# Patient Record
Sex: Female | Born: 1970 | Race: White | Hispanic: Yes | Marital: Married | Smoking: Never smoker
Health system: Southern US, Community
[De-identification: ages and names within clinical notes are randomized; demographics above are authoritative.]

## PROBLEM LIST (undated history)

## (undated) DIAGNOSIS — E785 Hyperlipidemia, unspecified: Secondary | ICD-10-CM

## (undated) DIAGNOSIS — E119 Type 2 diabetes mellitus without complications: Secondary | ICD-10-CM

## (undated) DIAGNOSIS — Z87898 Personal history of other specified conditions: Secondary | ICD-10-CM

## (undated) HISTORY — DX: Hyperlipidemia, unspecified: E78.5

## (undated) HISTORY — DX: Type 2 diabetes mellitus without complications: E11.9

## (undated) HISTORY — PX: TUBAL LIGATION: SHX77

## (undated) HISTORY — DX: Personal history of other specified conditions: Z87.898

---

## 1998-06-05 ENCOUNTER — Other Ambulatory Visit: Admission: RE | Admit: 1998-06-05 | Discharge: 1998-06-05 | Payer: Self-pay | Admitting: Gynecology

## 1998-09-21 ENCOUNTER — Emergency Department (HOSPITAL_COMMUNITY): Admission: EM | Admit: 1998-09-21 | Discharge: 1998-09-21 | Payer: Self-pay | Admitting: Emergency Medicine

## 2004-04-05 ENCOUNTER — Inpatient Hospital Stay (HOSPITAL_COMMUNITY): Admission: AD | Admit: 2004-04-05 | Discharge: 2004-04-05 | Payer: Self-pay | Admitting: *Deleted

## 2004-07-02 ENCOUNTER — Ambulatory Visit (HOSPITAL_COMMUNITY): Admission: RE | Admit: 2004-07-02 | Discharge: 2004-07-02 | Payer: Self-pay | Admitting: *Deleted

## 2004-07-02 ENCOUNTER — Ambulatory Visit: Payer: Self-pay | Admitting: *Deleted

## 2004-07-04 ENCOUNTER — Ambulatory Visit: Payer: Self-pay | Admitting: Family Medicine

## 2004-07-18 ENCOUNTER — Ambulatory Visit: Payer: Self-pay | Admitting: *Deleted

## 2004-12-02 ENCOUNTER — Ambulatory Visit: Payer: Self-pay | Admitting: Obstetrics and Gynecology

## 2004-12-05 ENCOUNTER — Ambulatory Visit: Payer: Self-pay | Admitting: *Deleted

## 2004-12-05 ENCOUNTER — Inpatient Hospital Stay (HOSPITAL_COMMUNITY): Admission: AD | Admit: 2004-12-05 | Discharge: 2004-12-08 | Payer: Self-pay | Admitting: Family Medicine

## 2006-03-13 ENCOUNTER — Ambulatory Visit: Payer: Self-pay | Admitting: Obstetrics & Gynecology

## 2006-03-31 ENCOUNTER — Ambulatory Visit (HOSPITAL_COMMUNITY): Admission: RE | Admit: 2006-03-31 | Discharge: 2006-03-31 | Payer: Self-pay | Admitting: Obstetrics & Gynecology

## 2006-03-31 ENCOUNTER — Ambulatory Visit: Payer: Self-pay | Admitting: Obstetrics & Gynecology

## 2006-04-15 ENCOUNTER — Ambulatory Visit: Payer: Self-pay | Admitting: Obstetrics and Gynecology

## 2013-02-21 ENCOUNTER — Encounter: Payer: Self-pay | Admitting: Internal Medicine

## 2013-02-21 ENCOUNTER — Ambulatory Visit: Payer: No Typology Code available for payment source | Attending: Internal Medicine | Admitting: Internal Medicine

## 2013-02-21 VITALS — BP 117/83 | HR 85 | Temp 97.8°F | Resp 17 | Wt 139.2 lb

## 2013-02-21 DIAGNOSIS — N920 Excessive and frequent menstruation with regular cycle: Secondary | ICD-10-CM | POA: Insufficient documentation

## 2013-02-21 DIAGNOSIS — K5900 Constipation, unspecified: Secondary | ICD-10-CM | POA: Insufficient documentation

## 2013-02-21 DIAGNOSIS — Z139 Encounter for screening, unspecified: Secondary | ICD-10-CM

## 2013-02-21 DIAGNOSIS — Z23 Encounter for immunization: Secondary | ICD-10-CM

## 2013-02-21 LAB — COMPLETE METABOLIC PANEL WITH GFR
ALT: 20 U/L (ref 0–35)
Alkaline Phosphatase: 69 U/L (ref 39–117)
GFR, Est Non African American: >89 mL/min
Sodium: 137 mEq/L (ref 135–145)
Total Bilirubin: 0.6 mg/dL (ref 0.3–1.2)
Total Protein: 8.1 g/dL (ref 6.0–8.3)

## 2013-02-21 LAB — CBC WITH DIFFERENTIAL/PLATELET
Basophils Absolute: 0 10*3/uL (ref 0.0–0.1)
Basophils Relative: 0 % (ref 0–1)
Eosinophils Absolute: 0.1 10*3/uL (ref 0.0–0.7)
MCH: 29.6 pg (ref 26.0–34.0)
MCHC: 34.1 g/dL (ref 30.0–36.0)
Neutrophils Relative %: 70 % (ref 43–77)
Platelets: 162 10*3/uL (ref 150–400)

## 2013-02-21 LAB — LIPID PANEL
HDL: 50 mg/dL (ref >39)
LDL Cholesterol: 114 mg/dL — ABNORMAL HIGH (ref 0–99)
Triglycerides: 204 mg/dL — ABNORMAL HIGH (ref <150)
VLDL: 41 mg/dL — ABNORMAL HIGH (ref 0–40)

## 2013-02-21 MED ORDER — MAGNESIUM HYDROXIDE 400 MG/5ML PO SUSP: 5.0000 mL | Freq: Every day | ORAL | Status: DC | PRN

## 2013-02-21 NOTE — Progress Notes (Signed)
MRN: [id number] Name: Amber Horn  Sex: female Age: 42 y.o. DOB: [birth year]  Allergies: Penicillins  Chief Complaint  Patient presents with  . Abdominal Pain  . Constipation    HPI: Patient is 42 y.o. female who comes for the first time to establish medical care, she reported to have lot of bloating and constipation, denies any nausea vomiting any urinary symptoms, also reported to have heavy menstrual periods, has not seen a gynecologist, denies any fever chills chest shortness of breath.  History reviewed. No pertinent past medical history.  History reviewed. No pertinent past surgical history.    Medication List       This list is accurate as of: 02/21/13  3:36 PM.  Always use your most recent med list.               magnesium hydroxide 400 MG/5ML suspension  Commonly known as:  MILK OF MAGNESIA  Take 5 mLs by mouth daily as needed for mild constipation.        Meds ordered this encounter  Medications  . magnesium hydroxide (MILK OF MAGNESIA) 400 MG/5ML suspension    Sig: Take 5 mLs by mouth daily as needed for mild constipation.    Dispense:  360 mL    Refill:  1    Immunization History  Administered Date(s) Administered  . Influenza,inj,Quad PF,36+ Mos 02/21/2013    History  Substance Use Topics  . Smoking status: Never Smoker   . Smokeless tobacco: Not on file  . Alcohol Use: No    Review of Systems  As noted in HPI  Filed Vitals:   02/21/13 1441  BP: 117/83  Pulse: 85  Temp: 97.8 F (36.6 C)  Resp: 17    Physical Exam  Physical Exam  Constitutional: She is oriented to person, place, and time. No distress.  Eyes: EOM are normal. Pupils are equal, round, and reactive to light.  Cardiovascular: Normal rate and regular rhythm.   Pulmonary/Chest: Breath sounds normal. No respiratory distress. She has no wheezes. She has no rales.  Musculoskeletal: She exhibits no edema.  Neurological: She is alert and oriented to person,  place, and time.    CBC No results found for this basename: wbc,  rbc,  hgb,  hct,  plt,  mcv,  neutrabs,  lymphsabs,  monoabs,  eosabs,  basosabs    CMP  No results found for this basename: na,  k,  cl,  co2,  glucose,  bun,  creatinine,  calcium,  prot,  albumin,  ast,  alt,  alkphos,  bilitot,  gfrnonaa,  gfraa    No results found for this basename: chol,  tri,  ldl    No components found with this basename: hga1c    No results found for this basename: AST    Assessment and Plan  Unspecified constipation - Plan: Prescribed magnesium hydroxide (MILK OF MAGNESIA) 400 MG/5ML suspension to use when necessary  Screening - Plan: MM Digital Screening, CBC with Differential, COMPLETE METABOLIC PANEL WITH GFR, TSH, Lipid panel, Vit D  25 hydroxy (rtn osteoporosis monitoring)  Menorrhagia - Plan: Ambulatory referral to Gynecology  flu shot given today    Health Maintenance : -Pap Smear: reffered to GYN  -Mammogram: ordered Flu shot given today   Return in about 6 weeks (around 04/04/2013).  Doris Cheadle, MD

## 2013-02-21 NOTE — Progress Notes (Signed)
Patient is here for check up Complains of having stomach pains Feels bloated and at times like her bowels are swollen When she gets this way she gets constipated Sometimes pain below her belly button States this has been an ongoing problem Needs referral for Ob/gyn as well

## 2013-03-16 ENCOUNTER — Encounter: Payer: Self-pay | Admitting: Obstetrics & Gynecology

## 2013-04-04 ENCOUNTER — Ambulatory Visit: Payer: No Typology Code available for payment source | Admitting: Internal Medicine

## 2013-04-18 ENCOUNTER — Encounter: Payer: No Typology Code available for payment source | Admitting: Obstetrics & Gynecology

## 2013-04-22 ENCOUNTER — Telehealth: Payer: Self-pay | Admitting: Internal Medicine

## 2013-04-22 NOTE — Telephone Encounter (Addendum)
Pt says she did not receive call with lab results after 02/21/13 office visit.  Also says she neve got a call with appt date/time for GYN referral or mammogram. Pt is still having abd pain.  Please f/u with pt regarding results and appts, she was supposed have these done before her next follow-up appt.

## 2013-04-25 ENCOUNTER — Ambulatory Visit: Payer: No Typology Code available for payment source | Admitting: Internal Medicine

## 2013-05-26 ENCOUNTER — Other Ambulatory Visit: Payer: Self-pay | Admitting: Obstetrics and Gynecology

## 2013-05-26 DIAGNOSIS — Z1231 Encounter for screening mammogram for malignant neoplasm of breast: Secondary | ICD-10-CM

## 2013-06-21 ENCOUNTER — Encounter (HOSPITAL_COMMUNITY): Payer: Self-pay

## 2013-06-21 ENCOUNTER — Ambulatory Visit (HOSPITAL_COMMUNITY)
Admission: RE | Admit: 2013-06-21 | Discharge: 2013-06-21 | Disposition: A | Discharge status: Home / Self Care | Payer: No Typology Code available for payment source | Source: Ambulatory Visit | Attending: Obstetrics and Gynecology | Admitting: Obstetrics and Gynecology

## 2013-06-21 VITALS — BP 118/64 | Temp 98.5°F | Ht <= 58 in | Wt 140.4 lb

## 2013-06-21 DIAGNOSIS — Z1239 Encounter for other screening for malignant neoplasm of breast: Secondary | ICD-10-CM

## 2013-06-21 DIAGNOSIS — Z1231 Encounter for screening mammogram for malignant neoplasm of breast: Secondary | ICD-10-CM

## 2013-06-21 NOTE — Patient Instructions (Signed)
Taught Kathaleen GrinderGuillermina Gonzalez-Garnica how to perform BSE and gave educational materials to take home. Patient did not need a Pap smear today due to last Pap smear was in March 2014 per patient. Let her know [facility] will cover Pap smears every 3 years unless has a history of abnormal Pap smears. Let patient know will follow up with her within the next couple weeks with results by letter or phone. Kathaleen GrinderGuillermina Gonzalez-Garnica verbalized understanding. Patient escorted to mammography for a screening mammogram.  Saintclair Halstedhristine Poll Aprille Sawhney, RN 11:12 AM

## 2013-06-21 NOTE — Progress Notes (Addendum)
No complaints today.  Pap Smear: Pap smear not completed today. Last Pap smear was in March 2014 at [facility] and normal per patient. Per patient has no history of an abnormal Pap smear. No Pap smear results in EPIC.  Physical exam: Breasts Breasts symmetrical. No skin abnormalities bilateral breasts. No nipple retraction bilateral breasts. No nipple discharge bilateral breasts. No lymphadenopathy. No lumps palpated bilateral breasts. No complaints of pain or tenderness on exam. Patient escorted to mammography for a screening mammogram.        Pelvic/Bimanual No Pap smear completed today since last Pap smear was in March 2014. Pap smear not indicated per [facility] guidelines.

## 2013-07-01 ENCOUNTER — Other Ambulatory Visit: Payer: No Typology Code available for payment source

## 2013-07-01 ENCOUNTER — Ambulatory Visit (HOSPITAL_BASED_OUTPATIENT_CLINIC_OR_DEPARTMENT_OTHER): Payer: No Typology Code available for payment source

## 2013-07-01 VITALS — BP 120/76 | HR 64 | Temp 98.1°F | Resp 16 | Ht <= 58 in | Wt 138.3 lb

## 2013-07-01 DIAGNOSIS — Z0000 Encounter for general adult medical examination without abnormal findings: Secondary | ICD-10-CM

## 2013-07-01 LAB — GLUCOSE (CC13): GLUCOSE: 114 mg/dL (ref 70–140)

## 2013-07-01 LAB — LIPID PANEL
CHOLESTEROL: 195 mg/dL (ref 0–200)
HDL: 46 mg/dL (ref >39)
LDL CALC: 122 mg/dL — AB (ref 0–99)
TRIGLYCERIDES: 133 mg/dL (ref <150)
Total CHOL/HDL Ratio: 4.2 Ratio
VLDL: 27 mg/dL (ref 0–40)

## 2013-07-01 LAB — HEMOGLOBIN A1C
HEMOGLOBIN A1C: 6.2 % — AB (ref <5.7)
Mean Plasma Glucose: 131 mg/dL — ABNORMAL HIGH (ref <117)

## 2013-07-01 NOTE — Patient Instructions (Signed)
  Discussed health assessment with patient. Discussed need for good shoes and leg support to improve hurting legs. She will be called with results of lab work and we will then discussed health Coaching needs. Patient verbalized understanding.

## 2013-07-01 NOTE — Progress Notes (Signed)
Patient [facility] a [facility] and is currently a [facility] patient effective 06/21/2013.   Clinical Measurements: Patient is 4 ft. 8 1/2 inches, weight 138.3 lbs, waist circumference 32.5 inches, and hip circumference 38 inches.   Medical History: Patient has no history of high cholesterol. Patient does not have a history of hypertension or diabetes. Per patient no diagnosed history of coronary heart disease, heart attack, heart failure, stroke/TIA, vascular disease or congenital heart defects.   Blood Pressure, Self-measurement: Patient states has no reason to check Blood pressure.  Nutrition Assessment: Patient stated that eats 1 fruit every day. Patient states she does not eat vegetables every day. She eats 3 or more ounces of whole grains daily. Patient doesn't eat two or more servings of fish weekly. Patient states she does drink less than 36 ounces or 450 calories of beverages with added sugars weekly.  Patient stated she does not watch her salt intake.  Physical Activity Assessment: Patient stated she does around 510 minutes of moderate exercise including walking and household chores a week. States that does no vigorous exercise.  Smoking Status: Patient had never smoked and is not around smoke.  Quality of Life Assessment: In assessing patient's quality of life she stated that out of the past 30 days that she has felt herPhysical health was not three days due to her feet hurting.Patient also stated that in the past 30 days that her mental health is  good including stress, depression and problems with emotions. Patient did state that out of the past 30 days she felt her physical or mental health had not kept her from doing her usual activities including self-care, work or recreation.   Plan: Lab work will be done today including a lipid panel, blood glucose, and Hgb A1C. Will call lab results when they are finished. Patient will return for Health Coaching and BP check.

## 2013-07-22 ENCOUNTER — Ambulatory Visit: Payer: No Typology Code available for payment source | Admitting: Internal Medicine

## 2013-11-11 ENCOUNTER — Ambulatory Visit: Payer: Self-pay

## 2013-11-11 DIAGNOSIS — Z789 Other specified health status: Secondary | ICD-10-CM

## 2013-11-11 NOTE — Patient Instructions (Signed)
Patient will follow 1600 calorie diet plan. Will review all handouts and exercise/activity book. Will increase fiber in diet. Will decrease Carbohydrates to less than 200 per day. Will increase exercise and use pedometer. Will measure portion sizes. Will call if has any questions. Will call patient in three weeks. Patient verbalized understanding.   

## 2013-11-11 NOTE — Progress Notes (Unsigned)
Patient returns today for Health Coaching regarding Nutrition for her borderline AIC and activity with interpreter.   NUTRITION: Patient and I went over Know Your Health Numbers again.Patient reviewed A1C handout to see about prediabetes, normal and  diabetes range. Patient went over what prediabetes is, complications that can result if do not get levels to normal, and diabetes level. Patient watched Video "Dorrene German" about how to control what eat and amount to keep blood sugar in the normal range.Discussed increasing fiber in diet, reading labels and serving sizes.  Discussed watching carbohydrates, how to count them, serving sizes and number of carbs per day allowance. Used plastic serving sizes to see portion sizes and what were starches, fruit and vegetables. Patient stated that she knew she was eating too much rice.Patient reviewed weight /activity chart. Per patient she does moderate activity and by age should be eating at 2,000 cal. Diet per day. Explained to patient and showed her that she is overweight and has BMI of 30.2. Patient went over 1,600 cal diet and we broke it down to the number of servings she could have. Patient received and reviewed the following handouts in Spanish: My Plate, Carb counting Menu, prediabetes, A1C Exam, Make half your grains whole. Gave patient measuring cup to measure serving sizes and demonstrated the serving sizes.  ACTIVITY: Discussed activity and walking Patient discussed other activity. Patient received pedometer and was explained and shown how it works. Received and reviewed Exercise and Physical Activity [employer] book in Spanish.  Miscellaneous: Discussed where can get orange card renewed and that I would be following up with her 3 more times because of Health Coaching ([place]), and would rescreen next year.  PLAN: Pursue orange card. Increasing amount of walking per day and add other exercises to plan, Decrease carbohydrates in diet. Lose weight. Follow up  times three per phone unless needs and can call.

## 2014-01-09 ENCOUNTER — Encounter (HOSPITAL_COMMUNITY): Payer: Self-pay

## 2015-03-23 ENCOUNTER — Other Ambulatory Visit: Payer: Self-pay | Admitting: Obstetrics and Gynecology

## 2015-03-23 DIAGNOSIS — Z1231 Encounter for screening mammogram for malignant neoplasm of breast: Secondary | ICD-10-CM

## 2015-03-28 ENCOUNTER — Telehealth (HOSPITAL_COMMUNITY): Payer: Self-pay | Admitting: *Deleted

## 2015-03-28 NOTE — Telephone Encounter (Signed)
Telephoned patient at home # and confirmed appointment with [facility]. Used interpreter Delorise Royals.

## 2015-03-29 ENCOUNTER — Encounter (HOSPITAL_COMMUNITY): Payer: Self-pay

## 2015-03-29 ENCOUNTER — Encounter (HOSPITAL_COMMUNITY): Payer: Self-pay | Admitting: *Deleted

## 2015-03-29 ENCOUNTER — Ambulatory Visit (HOSPITAL_COMMUNITY)
Admission: RE | Admit: 2015-03-29 | Discharge: 2015-03-29 | Disposition: A | Discharge status: Home / Self Care | Payer: Self-pay | Source: Ambulatory Visit | Attending: Obstetrics and Gynecology | Admitting: Obstetrics and Gynecology

## 2015-03-29 ENCOUNTER — Ambulatory Visit
Admission: RE | Admit: 2015-03-29 | Discharge: 2015-03-29 | Disposition: A | Discharge status: Home / Self Care | Payer: No Typology Code available for payment source | Source: Ambulatory Visit | Attending: Obstetrics and Gynecology | Admitting: Obstetrics and Gynecology

## 2015-03-29 VITALS — BP 108/64 | Temp 98.3°F | Ht 59.0 in | Wt 143.0 lb

## 2015-03-29 DIAGNOSIS — Z01419 Encounter for gynecological examination (general) (routine) without abnormal findings: Secondary | ICD-10-CM

## 2015-03-29 DIAGNOSIS — Z1231 Encounter for screening mammogram for malignant neoplasm of breast: Secondary | ICD-10-CM

## 2015-03-29 NOTE — Progress Notes (Signed)
No complaints today.   Pap Smear: Pap smear completed today. Last Pap smear was in March 2014 at the [facility] and normal per patient. Per patient has no history of an abnormal Pap smear. No Pap smear results in EPIC.  Physical exam: Breasts Breasts symmetrical. No skin abnormalities bilateral breasts. No nipple retraction bilateral breasts. No nipple discharge bilateral breasts. No lymphadenopathy. No lumps palpated bilateral breasts. No complaints of pain or tenderness on exam. Referred patient to the [facility] for a screening mammogram. Appointment scheduled for Thursday, March 29, 2015 at 1130.  Pelvic/Bimanual   Ext Genitalia No lesions, no swelling and no discharge observed on external genitalia.         Vagina Vagina pink and normal texture. No lesions and small amount of whitish/yellowish colored discharge observed in vagina.          Cervix Cervix is present. Cervix pink and of normal texture. Cervix friable. No discharge observed.     Uterus Uterus is present and palpable. Uterus in normal position and normal size.        Adnexae Bilateral ovaries present and palpable. No tenderness on palpation.          Rectovaginal No rectal exam completed today since patient had no rectal complaints. No skin abnormalities observed on exam.    Smoking History: Patient has never smoked.  Patient Navigation: Patient education provided. Access to services provided for patient through [facility]. Spanish interpreter provided.  Used Spanish interpreter [employer].

## 2015-03-29 NOTE — Patient Instructions (Signed)
Educational materials on self breast awareness given. Explained to Amber Horn that [facility] will cover Pap smears and HPV typing every 5 years unless has a history of abnormal Pap smears. Referred patient to the [facility] for a screening mammogram. Appointment scheduled for Thursday, March 29, 2015 at 1130. Patient aware of appointment and will be there. Let patient know will follow up with her within the next couple weeks with results of Pap smear by phone. Let patient know the Breast Center will follow up with her within the next couple of weeks with result to mammogram by letter or phone. Amber Horn verbalized understanding.  Amber Horn, Amber Maser, RN 1:05 PM

## 2015-03-30 LAB — CYTOLOGY - PAP

## 2015-04-02 ENCOUNTER — Telehealth (HOSPITAL_COMMUNITY): Payer: Self-pay | Admitting: *Deleted

## 2015-04-02 NOTE — Telephone Encounter (Signed)
Telephoned patient at home # and discussed negative pap smear results. HPV was negative. Next pap smear due in 5 years. Used interpreter Julie Sowell.  

## 2015-04-06 ENCOUNTER — Other Ambulatory Visit: Payer: No Typology Code available for payment source

## 2015-04-06 ENCOUNTER — Ambulatory Visit (HOSPITAL_BASED_OUTPATIENT_CLINIC_OR_DEPARTMENT_OTHER): Payer: Self-pay

## 2015-04-06 VITALS — BP 106/78 | HR 72 | Temp 97.9°F | Resp 16 | Ht 59.0 in | Wt 141.8 lb

## 2015-04-06 DIAGNOSIS — Z0000 Encounter for general adult medical examination without abnormal findings: Secondary | ICD-10-CM

## 2015-04-06 LAB — LIPID PANEL
Chol/HDL Ratio: 3.9 ratio units (ref 0.0–4.4)
Cholesterol, Total: 183 mg/dL (ref 100–199)
HDL: 47 mg/dL (ref >39)
LDL Calculated: 114 mg/dL — ABNORMAL HIGH (ref 0–99)
Triglycerides: 112 mg/dL (ref 0–149)
VLDL Cholesterol Cal: 22 mg/dL (ref 5–40)

## 2015-04-06 LAB — GLUCOSE (CC13): GLUCOSE: 103 mg/dL (ref 70–140)

## 2015-04-06 NOTE — Progress Notes (Signed)
Patient is returning as a re screen to the [employer] and is currently a [facility] patient effective 03/29/2015. An interpreter was not present until lab work. Was able to complete by self with my spanish knowledge and patient's little English knowledge  Clinical Measurements: Patient is 4 ft.11 inches, weight 141.8 lbs, BMI 28.7 .   Medical History: Patient has no history of high cholesterol. Patient does not have a history of hypertension or diabetes. Per patient no diagnosed history of coronary heart disease, heart attack, heart failure, stroke/TIA, vascular disease or congenital heart defects.   Blood Pressure, Self-measurement: Patient states has no reason to check Blood pressure.  Nutrition Assessment: Patient stated that eats 1 fruit every day. Patient states she does not eat vegetables every day. Per patient states does eat 3 or more ounces or more of whole grains daily. Patient stated does not eat two or more servings of fish weekly. Patient states she does  Not drink more than 36 ounces or 450 calories of beverages with added sugars weekly. Patient stated she does not watch her salt intake.  Physical Activity Assessment: Patient stated that does zumba walks 3 days a week for total of 180 minutes of moderate exercise. Patient does on occasion do 60 minutes of vigorous exercise.  Smoking Status: Patient has never smoked and is not exposed to smoke..  Quality of Life Assessment: In assessing patient's quality of life she stated that out of the past 30 days that she has felt her health is good all of them. Patient also stated that in the past 30 days that her mental health was good including stress, depression and problems with emotions for all days. Patient did state that out of the past 30 days she felt her physical or mental health had not kept her from doing her usual activities including self-care, work or recreation.   Plan: Lab work was done today including a lipid panel, blood  glucose, and Hgb A1C. Will call lab results when results are finished. Will discuss risk reduction counseling when call results. Will follow through with program's goals.

## 2015-04-06 NOTE — Patient Instructions (Signed)
Discussed health assessment with patient.She will be called with results of lab work and we will then discussed any further follow up the patient needs. Patient verbalized understanding. 

## 2015-04-07 LAB — HEMOGLOBIN A1C
Est. average glucose Bld gHb Est-mCnc: 131 mg/dL
Hemoglobin A1c: 6.2 % — ABNORMAL HIGH (ref 4.8–5.6)

## 2015-04-10 ENCOUNTER — Telehealth: Payer: Self-pay

## 2015-04-10 NOTE — Telephone Encounter (Signed)
Called to inform about lab work from 04/06/15. Interpreter, Delorise Royals informed patient: cholesterol- 183, HDL- 47, LDL- 114, triglycerides - 034, Bld Glucose -103 and HBG-A1C - 6.2. Did risk reduction counseling concerning carbohydrates and sugars. Patient stated that was interested in [employer]. Set up health Coaching for February 17 at 8:30 AM at cancer center.

## 2015-04-26 ENCOUNTER — Telehealth: Payer: Self-pay

## 2015-04-26 NOTE — Telephone Encounter (Signed)
Called per Delorise Royals Interpreter to remind of appointment.

## 2015-04-27 ENCOUNTER — Ambulatory Visit: Payer: No Typology Code available for payment source

## 2015-04-27 DIAGNOSIS — Z789 Other specified health status: Secondary | ICD-10-CM

## 2015-04-27 NOTE — Patient Instructions (Signed)
PLAN: Will add 15 to 30 minute walks at least three days a week. Will use Stevia to sweeten. Will decrease sugar intake until not eating sweets except on rare occasion. Will read food labels. Will call if needs any assistance.

## 2015-04-27 NOTE — Progress Notes (Signed)
Patient returns today for Health Coaching regarding pre-diabetes and elevated LDL with interpreter Delorise Royals.  FOLLOW UP ASSESSMENT: Reviewed patients labs and compared them to results from April 2015. All lab results had lower values except HGB A1C. A1C was still 6.2 in pre diabetes range.   PRE DIABETES: Cahterine stated that I guess I can not have anymore sugar. Discussed ranges of A1C, My Plate, serving sizes, carbohydrates and servings per food group. Looked at hand out and discussed reading labels. Patient received handouts on label reading, Serving sizes of all food groups and number of servings can have per day written in Spanish. Discussed natural artifical sweeteners (Stevia) verses all other sweeteners. Discussed some snacks that might help urgings.    HDL's: Discussed and remind patient that fiber and exercise help to decrease LDL's. Discussed besides Zumba that patient may want to increase any walking to help decrease LDL's. Discussed Chi seeds and other food that have a lot of fiber and where to find on food label.  PLAN: Call in 3 to 4 weeks,

## 2015-05-28 ENCOUNTER — Emergency Department (HOSPITAL_COMMUNITY)
Admission: EM | Admit: 2015-05-28 | Discharge: 2015-05-28 | Disposition: A | Discharge status: Home / Self Care | Payer: Self-pay | Attending: Emergency Medicine | Admitting: Emergency Medicine

## 2015-05-28 ENCOUNTER — Emergency Department (HOSPITAL_COMMUNITY): Payer: Self-pay

## 2015-05-28 ENCOUNTER — Encounter (HOSPITAL_COMMUNITY): Payer: Self-pay | Admitting: Emergency Medicine

## 2015-05-28 DIAGNOSIS — S4992XA Unspecified injury of left shoulder and upper arm, initial encounter: Secondary | ICD-10-CM | POA: Insufficient documentation

## 2015-05-28 DIAGNOSIS — Y92410 Unspecified street and highway as the place of occurrence of the external cause: Secondary | ICD-10-CM | POA: Insufficient documentation

## 2015-05-28 DIAGNOSIS — Y9389 Activity, other specified: Secondary | ICD-10-CM | POA: Insufficient documentation

## 2015-05-28 DIAGNOSIS — S199XXA Unspecified injury of neck, initial encounter: Secondary | ICD-10-CM | POA: Insufficient documentation

## 2015-05-28 DIAGNOSIS — Z880 Allergy status to penicillin: Secondary | ICD-10-CM | POA: Insufficient documentation

## 2015-05-28 DIAGNOSIS — M546 Pain in thoracic spine: Secondary | ICD-10-CM

## 2015-05-28 DIAGNOSIS — S0990XA Unspecified injury of head, initial encounter: Secondary | ICD-10-CM | POA: Insufficient documentation

## 2015-05-28 DIAGNOSIS — S4991XA Unspecified injury of right shoulder and upper arm, initial encounter: Secondary | ICD-10-CM | POA: Insufficient documentation

## 2015-05-28 DIAGNOSIS — Y999 Unspecified external cause status: Secondary | ICD-10-CM | POA: Insufficient documentation

## 2015-05-28 DIAGNOSIS — S299XXA Unspecified injury of thorax, initial encounter: Secondary | ICD-10-CM | POA: Insufficient documentation

## 2015-05-28 MED ORDER — IBUPROFEN 400 MG PO TABS
600.0000 mg | ORAL_TABLET | Freq: Once | ORAL | Status: AC
  Administered 2015-05-28: 600 mg via ORAL
  Filled 2015-05-28: qty 1

## 2015-05-28 MED ORDER — IBUPROFEN 600 MG PO TABS: 600.0000 mg | ORAL_TABLET | Freq: Four times a day (QID) | ORAL | Status: DC | PRN
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
## 2015-05-28 NOTE — ED Notes (Signed)
Declined W/C at D/C and was escorted to lobby by RN. 

## 2015-05-28 NOTE — ED Notes (Signed)
PT declined Vitals at time of DC because she needed to leave.

## 2015-05-28 NOTE — Discharge Instructions (Signed)
Read the information below.  Use the prescribed medication as directed.  Please discuss all new medications with your pharmacist.  You may return to the Emergency Department at any time for worsening condition or any new symptoms that concern you.     If you develop fevers, loss of control of bowel or bladder, difficulty breathing, cough, fever, weakness or numbness in your legs, or are unable to walk, return to the ER for a recheck.   Lea la informacin a continuacin. Use la medicacin prescrita como se indica. Por favor discuta todos los nuevos medicamentos con su farmacutico. Usted puede regresar al Departamento de Emergencias en cualquier momento por empeoramiento de la condicin o cualquier nuevo sntoma que le afecte. Si desarrolla fiebre, prdida de control del intestino o vejiga, dificultad para respirar, tos, fiebre, debilidad o entumecimiento en las piernas, o no puede caminar, regrese a la sala de emergencias para una nueva revisin.   Colisin con un vehculo de motor Academic librarian(Motor Vehicle Collision) Despus de sufrir un accidente automovilstico, es normal tener diversos hematomas y Smith Internationaldolores musculares. Generalmente, estas molestias son peores durante las primeras 24 horas. En las primeras horas, probablemente sienta mayor entumecimiento y Engineer, miningdolor. Tambin puede sentirse peor al despertarse la maana posterior a la colisin. A partir de all, debera comenzar a Associate Professormejorar da a da. La velocidad con que se mejora generalmente depende de la gravedad de la colisin y la cantidad, Chinaubicacin y Firefighternaturaleza de las lesiones. INSTRUCCIONES PARA EL CUIDADO EN EL HOGAR   Aplique hielo sobre la zona lesionada.  Ponga el hielo en una bolsa plstica.  Colquese una toalla entre la piel y la bolsa de hielo.  Deje el hielo durante 15 a 20minutos, 3 a 4veces por da, o segn las indicaciones del mdico.  Albesa SeenBeba suficiente lquido para mantener la orina clara o de color amarillo plido. No beba alcohol.  Tome  una ducha o un bao tibio una o dos veces al da. Esto aumentar el flujo de Computer Sciences Corporationsangre hacia los msculos doloridos.  Puede retomar sus actividades normales cuando se lo indique el mdico. Tenga cuidado al levantar objetos, ya que puede agravar el dolor en el cuello o en la espalda.  Utilice los medicamentos de venta libre o recetados para Primary school teachercalmar el dolor, el malestar o la fiebre, segn se lo indique el mdico. No tome aspirina. Puede aumentar los hematomas o la hemorragia. SOLICITE ATENCIN MDICA DE INMEDIATO SI:  Tiene entumecimiento, hormigueo o debilidad en los brazos o las piernas.  Tiene dolor de cabeza intenso que no mejora con medicamentos.  Siente un dolor intenso en el cuello, especialmente con la palpacin en el centro de la espalda o el cuello.  Disminuye su control de la vejiga o los intestinos.  Aumenta el dolor en cualquier parte del cuerpo.  Le falta el aire, tiene sensacin de desvanecimiento, mareos o Newell Rubbermaiddesmayos.  Siente dolor en el pecho.  Tiene malestar estomacal (nuseas), vmitos o sudoracin.  Cada vez siente ms dolor abdominal.  Anola Gurneybserva sangre en la orina, en la materia fecal o en el vmito.  Siente dolor en los hombros (en la zona del cinturn de seguridad).  Siente que los sntomas empeoran. ASEGRESE DE QUE:   Comprende estas instrucciones.  Controlar su afeccin.  Recibir ayuda de inmediato si no mejora o si empeora.   Esta informacin no tiene Theme park managercomo fin reemplazar el consejo del mdico. Asegrese de hacerle al mdico cualquier pregunta que tenga.   Document Released: 12/04/2004 Document Revised: 03/17/2014 Elsevier Interactive  Patient Education 2016 Elsevier Inc.  Management consultant (Musculoskeletal Pain) El dolor musculoesqueltico se siente en huesos y msculos. El dolor puede ocurrir en cualquier parte del cuerpo. El profesional que lo asiste podr tratarlo sin Geologist, engineering causa del dolor. Lo tratar Time Warner de laboratorio  (sangre y [place]), las radiografas y otros estudios sean normales. La causa de estos dolores puede ser un virus.  CAUSAS Generalmente no existe una causa definida para este trastorno. Tambin el Citigroup puede deberse a la Santel. En la actividad excesiva se incluye el hacer ejercicios fsicos muy intensos cuando no se est en buena forma. El dolor de huesos tambin puede deberse a cambios climticos. Los huesos son sensibles a los cambios en la presin atmosfrica. INSTRUCCIONES PARA EL CUIDADO DOMICILIARIO  Para proteger su privacidad, no se entregarn los [employer] pruebas por telfono. Asegrese de conseguirlos. Consulte el modo en que podr obtenerlos si no se lo han informado. Es su responsabilidad contar con los Lubrizol Corporation.  Utilice los medicamentos de venta libre o de prescripcin para Chief Technology Officer, Environmental health practitioner o la [place], segn se lo indique el profesional que lo asiste. Si le han administrado medicamentos, no conduzca, no opere maquinarias ni Diplomatic Services operational officer, y tampoco firme documentos legales durante 24 horas. No beba alcohol. No tome pldoras para dormir ni otros medicamentos que Museum/gallery curator.  Podr seguir con todas las actividades a menos que stas le ocasionen ms Merck & Co. Cuando el dolor disminuya, es importante que gradualmente reanude toda la rutina habitual. Retome las actividades comenzando lentamente. Aumente gradualmente la intensidad y la duracin de sus actividades o del ejercicio.  Durante los perodos de dolor intenso, el reposo en cama puede ser beneficioso. Recustese o sintese en la posicin que le sea ms cmoda.  Coloque hielo sobre la zona afectada.  Ponga hielo en Lucile Shutters.  Colquese una toalla entre la piel y la bolsa de hielo.  Aplique el hielo durante 10 a 20 minutos 3  4 veces por da.  Si el dolor empeora, o no desaparece puede ser Northeast Utilities repetir las pruebas o Education officer, environmental nuevos exmenes. El  profesional que lo asiste podr requerir investigar ms profundamente para Veterinary surgeon causa posible. SOLICITE ATENCIN MDICA DE INMEDIATO SI:  Siente que el dolor empeora y no se alivia con los medicamentos.  Siente dolor en el pecho asociado a falta de aire, sudoracin, nuseas o vmitos.  El dolor se localiza en el abdomen.  Comienza a sentir nuevos sntomas que parecen ser diferentes o que lo preocupan. ASEGRESE DE QUE:   Comprende las instrucciones para el alta mdica.  Controlar su enfermedad.  Solicitar atencin mdica de inmediato segn las indicaciones.   Esta informacin no tiene Theme park manager el consejo del mdico. Asegrese de hacerle al mdico cualquier pregunta que tenga.   Document Released: 12/04/2004 Document Revised: 05/19/2011 Elsevier Interactive Patient Education [employer].

## 2015-07-03 ENCOUNTER — Telehealth: Payer: Self-pay

## 2015-07-03 NOTE — Telephone Encounter (Signed)
Patient stated that wanted to come in and talk about PCP and herbal lite. Stated is doing exercises. Patient scheduled for 4?28 at 11am.

## 2015-07-06 ENCOUNTER — Ambulatory Visit: Payer: No Typology Code available for payment source

## 2015-07-30 ENCOUNTER — Telehealth: Payer: Self-pay

## 2015-07-30 NOTE — Telephone Encounter (Signed)
[facility] Patient called per phone today for Health Coaching by interpreter Delorise RoyalsJulie Sowell. Patient's areas of concern were LDL's and A1C.  HEALTH COACHING: Patient stated had changed eating habits. Per patient has increased the amount of vegetables and foods with fiber she is eating. Patient stated that is not eating at night.Patient states that has lost 3 lbs. Discussed different things that have a lot of sugar in them and to make sure is reading food labels.  EXERCISE: Per patient is walking 30 minutes daily. Asked patient about whether has added any balance or flexibility exercises to plan.  PLAN: Will Call in 3 to 4 weeks. Will continue with increasing exercise. Will continue with the nutritional changes

## 2016-04-24 ENCOUNTER — Other Ambulatory Visit: Payer: Self-pay | Admitting: Obstetrics and Gynecology

## 2016-04-24 DIAGNOSIS — Z1231 Encounter for screening mammogram for malignant neoplasm of breast: Secondary | ICD-10-CM

## 2016-05-08 ENCOUNTER — Encounter (HOSPITAL_COMMUNITY): Payer: Self-pay

## 2016-05-08 ENCOUNTER — Ambulatory Visit (HOSPITAL_COMMUNITY)
Admission: RE | Admit: 2016-05-08 | Discharge: 2016-05-08 | Disposition: A | Discharge status: Home / Self Care | Payer: Self-pay | Source: Ambulatory Visit | Attending: Obstetrics and Gynecology | Admitting: Obstetrics and Gynecology

## 2016-05-08 ENCOUNTER — Ambulatory Visit
Admission: RE | Admit: 2016-05-08 | Discharge: 2016-05-08 | Disposition: A | Discharge status: Home / Self Care | Payer: No Typology Code available for payment source | Source: Ambulatory Visit | Attending: Obstetrics and Gynecology | Admitting: Obstetrics and Gynecology

## 2016-05-08 VITALS — BP 114/76 | Temp 98.0°F | Ht 59.0 in | Wt 139.0 lb

## 2016-05-08 DIAGNOSIS — Z1239 Encounter for other screening for malignant neoplasm of breast: Secondary | ICD-10-CM

## 2016-05-08 DIAGNOSIS — Z1231 Encounter for screening mammogram for malignant neoplasm of breast: Secondary | ICD-10-CM

## 2016-05-08 NOTE — Progress Notes (Signed)
No complaints today.   Pap Smear: Pap smear not completed today. [facility] and normal with negative HPV. Per patient has no history of an abnormal Pap smear. Last Pap smear result is in EPIC.  Physical exam: Breasts Breasts symmetrical. No skin abnormalities bilateral breasts. No nipple retraction bilateral breasts. No nipple discharge bilateral breasts. No lymphadenopathy. No lumps palpated bilateral breasts. No complaints of pain or tenderness on exam. [facility]. Appointment scheduled for Thursday, May 08, 2016 at 0910.        Pelvic/Bimanual No Pap smear completed today since last Pap smear and HPV typing was 03/29/2015. Pap smear not indicated per BCCCP guidelines.   Smoking History: Patient has never smoked.  Patient Navigation: Patient education provided. [facility]. Spanish interpreter provided.  Used Spanish interpreter Halliburton CompanyBlanca Lindner from [facility].

## 2016-05-08 NOTE — Patient Instructions (Signed)
Explained breast self awareness with [name]. Patient did not need a Pap smear today due to last Pap smear and HPV typing was 03/29/2015. Let her know BCCCP will cover Pap smears and HPV typing every 5 years unless has a history of abnormal Pap smears. [facility]. Appointment scheduled for Thursday, May 08, 2016 at 0910. Let patient know the Breast Center will follow up with her within the next couple weeks with results of mammogram by letter or phone. [name] verbalized understanding.  Rhenda Oregon, Kathaleen Maserhristine Poll, RN 9:54 AM

## 2016-05-09 ENCOUNTER — Encounter (HOSPITAL_COMMUNITY): Payer: Self-pay | Admitting: *Deleted

## 2016-05-14 ENCOUNTER — Other Ambulatory Visit: Payer: Self-pay

## 2016-05-14 DIAGNOSIS — Z0000 Encounter for general adult medical examination without abnormal findings: Secondary | ICD-10-CM

## 2016-05-16 ENCOUNTER — Other Ambulatory Visit: Payer: Self-pay

## 2016-05-16 ENCOUNTER — Ambulatory Visit (HOSPITAL_BASED_OUTPATIENT_CLINIC_OR_DEPARTMENT_OTHER): Payer: Self-pay | Admitting: *Deleted

## 2016-05-16 VITALS — BP 118/68 | Ht 59.0 in | Wt 139.0 lb

## 2016-05-16 DIAGNOSIS — Z01419 Encounter for gynecological examination (general) (routine) without abnormal findings: Secondary | ICD-10-CM

## 2016-05-16 DIAGNOSIS — Z0000 Encounter for general adult medical examination without abnormal findings: Secondary | ICD-10-CM

## 2016-05-16 LAB — GLUCOSE (CC13): GLUCOSE: 100 mg/dL (ref 70–140)

## 2016-05-16 NOTE — Progress Notes (Signed)
Wisewoman Initial Screening  Patient referred to [facility] program by [facility].   Clinical Measurement: HT: 59in WT: 139lb BP: 118/68 BPx2: 116/70, fasting labs drawn today including cholesterol, LDL, HDL, triglycerides, glucose and A1C - will follow up when results are back.   Medical History: Patient is not sure if she has high cholesterol, does not have hx of HTN or diabetes. Has never been diagnosed with heart disease.   Medication: Patient is not currently taking any medications.   Blood Pressure, Self Measurement: N/A  Nutrition: Patient eats 1-2 cups of fruits and vegetables on an average day. She does not eat fish regularly. She eats more than 3 ounces of whole grains daily. Does not drink many sugary drinks, once a week she will drink a small soda. She watches her sodium intake.   Physical Activity: Patient does zumba 3-4 times a week for an hour.   Smoking Status: never a smoker, does not ride in a vehicle with anyone who smokes.   Quality of Life: She has not had any mental health/physical health issues preventing her from performing her normal daily activities in the last 30 days.   Risk Reduction and Counseling/ Health Coaching session 1: Patient has set a goal of increasing her vegetable intake to 2 cups daily and fruit to 1 cup daily. She was provided with portion control cup and plate to help her reach her goal. She is in the action stage of the readiness ruler and will continue to follow up with her goal.  Navigation: Will call patient with lab results within a week as well as her second health coaching session.

## 2016-05-17 LAB — LIPID PANEL
CHOLESTEROL TOTAL: 192 mg/dL (ref 100–199)
Chol/HDL Ratio: 3.9 ratio units (ref 0.0–4.4)
HDL: 49 mg/dL (ref >39)
LDL CALC: 118 mg/dL — AB (ref 0–99)
TRIGLYCERIDES: 123 mg/dL (ref 0–149)
VLDL CHOLESTEROL CAL: 25 mg/dL (ref 5–40)

## 2016-05-17 LAB — HEMOGLOBIN A1C
Est. average glucose Bld gHb Est-mCnc: 123 mg/dL
Hemoglobin A1c: 5.9 % — ABNORMAL HIGH (ref 4.8–5.6)

## 2016-05-21 ENCOUNTER — Telehealth: Payer: Self-pay

## 2016-05-21 NOTE — Telephone Encounter (Signed)
Attempted to call patient with lab results with interpretor Delorise RoyalsJulie Sowell. No answer.

## 2016-05-21 NOTE — Telephone Encounter (Signed)
Health Coaching # 2  Initial Screening: 05/16/16  Interpretor: Delorise RoyalsJulie Sowell  Labs: Cholesterol: 192 HDL: 49 LDL: 118 triglycerides: 123 A1C 5.9 Glucose: 100. Patient informed and explained lab results. She understands her A1C is elevated and she should avoid concentrated sweets, no follow up is required at this time through [facility] but she should follow up with her PCP when she can.  Goal: Patients goal is to increase vegetable intake to 2 cups a day and fruit intake to 1 cup a day. Patient states she is following these guidelines and has been reading the pamphlets that were given to her.   Navigation: Time spent with patient via phone: 10 Minutes, patient understands there will be another health coaching session in 2-3 weeks.

## 2016-06-11 ENCOUNTER — Telehealth (HOSPITAL_COMMUNITY): Payer: Self-pay

## 2016-06-11 NOTE — Telephone Encounter (Signed)
Health Coaching #3  Initial Screening: 05/16/16  Interpretor:Julie Sowell  Goal:Patients goal was to increase her vegetables to 2 cups a day and fruit to 1 cup a day. Patient states she is using her wisewoman portion control cup to help her and she has been doing better.  Navigation: Time spent with patient via phone: 10 Minutes, patient understands there will be a final follow up call in 4- 6 weeks.

## 2016-12-10 ENCOUNTER — Telehealth (HOSPITAL_COMMUNITY): Payer: Self-pay

## 2016-12-10 NOTE — Telephone Encounter (Signed)
WISEWOMAN Follow Up  Interpreter Raynelle Fanning Sowell  Medication: Patient is not currently taking any medication for cholesterol, blood pressure or diabetes.  Blood Pressure, Self Measurement: N/A  Nutrition: Patient eats 1 cup of fruit daily. She eats 1/2 cup of vegetables daily. She eats more than 2 servings of fish daily. Patient eats more than 3oz of whole grains daily. She drinks less than 36oz of beverages weekly and watches her sodium intake.   Physical Activity: Patient gets 90 minutes of moderate physical activity weekly. She gets 90 minutes of vigorous activity weekly.  Smoking Status: Patient has never smoked and does not ride in a car with anyone that smokes.  Quality of Life: She has not had any metal/physical health issues preventing her from preforming her normal daily activities in the last 30 days.   Navigation: Patient states that she would be interested in renewing her wisewoman when she is eligible.

## 2016-12-22 ENCOUNTER — Encounter (HOSPITAL_COMMUNITY): Payer: Self-pay

## 2016-12-29 ENCOUNTER — Encounter (HOSPITAL_COMMUNITY): Payer: Self-pay

## 2017-02-27 ENCOUNTER — Encounter (HOSPITAL_COMMUNITY): Payer: Self-pay

## 2017-06-08 ENCOUNTER — Other Ambulatory Visit (HOSPITAL_COMMUNITY): Payer: Self-pay

## 2017-06-08 DIAGNOSIS — Z0000 Encounter for general adult medical examination without abnormal findings: Secondary | ICD-10-CM

## 2017-06-10 ENCOUNTER — Inpatient Hospital Stay: Payer: Self-pay | Attending: Obstetrics and Gynecology | Admitting: *Deleted

## 2017-06-10 ENCOUNTER — Inpatient Hospital Stay: Payer: Self-pay

## 2017-06-10 VITALS — BP 114/80 | Ht <= 58 in | Wt 141.9 lb

## 2017-06-10 DIAGNOSIS — Z0000 Encounter for general adult medical examination without abnormal findings: Secondary | ICD-10-CM

## 2017-06-10 LAB — HEMOGLOBIN A1C
HEMOGLOBIN A1C: 5.9 % — AB (ref 4.8–5.6)
MEAN PLASMA GLUCOSE: 122.63 mg/dL

## 2017-06-10 LAB — LIPID PANEL
CHOLESTEROL: 202 mg/dL — AB (ref 0–200)
HDL: 59 mg/dL (ref >40)
LDL Cholesterol: 118 mg/dL — ABNORMAL HIGH (ref 0–99)
TRIGLYCERIDES: 123 mg/dL (ref <150)
Total CHOL/HDL Ratio: 3.4 RATIO
VLDL: 25 mg/dL (ref 0–40)

## 2017-06-10 NOTE — Patient Instructions (Signed)
Health Coaching: Patient made goals today to eat healthier and exercise more. Encouraged patient to continue what she is currently doing and add in healthier choices.

## 2017-06-10 NOTE — Progress Notes (Signed)
WISEWOMAN INITIAL SCREENING   Interpreter: Natale LayErika McReynolds   Clinical Measurement: HT: 58in  WT: 141.9lbs  BP: 118/84 BPx2: 114/80 fasting labs drawn today, will review with patient when they result.  Medical History: Patient states that she has not been diagnosed with any of the following conditions: hyperlipidemia, HTN, diabetes or heart disease. Patient states that she has not been diagnosed with any of the following conditions: hyperlipidemia, HTN, diabetes or heart disease. Patient states that she was told that she was pre-diabetic.  Medications: Patient does not take medications for HTN, hyperlipidemia or diabetes. She is not taking aspirin daily to prevent heart attack or stroke.    Blood Pressure, Self Measurement: Patient does not measure her blood pressure at home.  Nutrition: Patient states that she eats 1 cups of fruit , and 1/4 cup of vegetables in an average day. She does not eat fish regularly. She eats less than half a serving of whole grains daily. She does not drink less than 36 ounces of beverages with added sugar weekly. She is currently watching her sodium intake. She has not had any drinks containing alcohol in the last 7 days.    Physical Activity:Patient states that she gets 20 minutes of moderate exercise in a week. She gets 240 minutes of vigorous exercise in a week.  Smoking Status: Patient has never smoked. She is not around any smokers.   Quality of Life: Patient states that she has has had 0 bad physical health out of the last 30 days. In the last 2 weeks she has had 0 days that she has felt down or depressed. She has had 0 days in the last 2 weeks that she has had little interest or pleasure in doing things.   Risk Reduction and Counseling: Patient states that she wants to eat healthier and increase exercise. Encouraged her to continue with what she is currently doing and add healthier options.   Navigation: Will notify patient of lab results within a week.  Patient aware of two more health coaching sessions and a follow up.

## 2017-06-10 NOTE — Addendum Note (Signed)
Addended by: Yeiren Whitecotton on: 06/10/2017 09:59 AM   Modules accepted: Orders  

## 2017-06-10 NOTE — Addendum Note (Signed)
Addended by: Kathreen CornfieldSMITH, Lautaro Koral on: 06/10/2017 09:59 AM   Modules accepted: Orders

## 2017-07-01 ENCOUNTER — Telehealth (HOSPITAL_COMMUNITY): Payer: Self-pay | Admitting: *Deleted

## 2017-07-01 NOTE — Telephone Encounter (Signed)
   Interpreter Delorise RoyalsJulie Sowell  Patient is aware of her lab results.  Patient states she will checking her glucose levels and carbohydrate intake.  Patient states she will exercise by walking.

## 2017-07-06 ENCOUNTER — Telehealth (HOSPITAL_COMMUNITY): Payer: Self-pay | Admitting: *Deleted

## 2017-07-06 NOTE — Telephone Encounter (Signed)
Follow up 06-30-17  Spanish Interpreter Delorise Royals  Patient is aware of her results and  states she will be checking her glucose.  She states she will be watching her carb intake and that  she will increase her exercise by walking more.

## 2017-07-24 ENCOUNTER — Other Ambulatory Visit: Payer: Self-pay | Admitting: Obstetrics and Gynecology

## 2017-07-24 DIAGNOSIS — Z1231 Encounter for screening mammogram for malignant neoplasm of breast: Secondary | ICD-10-CM

## 2017-08-13 ENCOUNTER — Encounter (HOSPITAL_COMMUNITY): Payer: Self-pay | Admitting: *Deleted

## 2017-08-13 ENCOUNTER — Ambulatory Visit (HOSPITAL_COMMUNITY)
Admission: RE | Admit: 2017-08-13 | Discharge: 2017-08-13 | Disposition: A | Discharge status: Home / Self Care | Payer: Self-pay | Source: Ambulatory Visit | Attending: Obstetrics and Gynecology | Admitting: Obstetrics and Gynecology

## 2017-08-13 ENCOUNTER — Encounter (HOSPITAL_COMMUNITY): Payer: Self-pay

## 2017-08-13 ENCOUNTER — Ambulatory Visit
Admission: RE | Admit: 2017-08-13 | Discharge: 2017-08-13 | Disposition: A | Discharge status: Home / Self Care | Payer: No Typology Code available for payment source | Source: Ambulatory Visit | Attending: Obstetrics and Gynecology | Admitting: Obstetrics and Gynecology

## 2017-08-13 VITALS — BP 108/64

## 2017-08-13 DIAGNOSIS — Z1231 Encounter for screening mammogram for malignant neoplasm of breast: Secondary | ICD-10-CM

## 2017-08-13 DIAGNOSIS — Z1239 Encounter for other screening for malignant neoplasm of breast: Secondary | ICD-10-CM

## 2017-08-13 NOTE — Progress Notes (Signed)
No complaints today.   Pap Smear: Pap smear not completed today. [facility] and normal with negative HPV. Per patient has no history of an abnormal Pap smear. Last Pap smear result is in EPIC.  Physical exam: Breasts Breasts symmetrical. No skin abnormalities bilateral breasts. No nipple retraction bilateral breasts. No nipple discharge bilateral breasts. No lymphadenopathy. No lumps palpated bilateral breasts. No complaints of pain or tenderness on exam. [facility]. Appointment scheduled for Thursday, August 13, 2017 at 0910.    Pelvic/Bimanual No Pap smear completed today since last Pap smear and HPV typing was 03/29/2015. Pap smear not indicated per BCCCP guidelines.   Smoking History: Patient has never smoked.  Patient Navigation: Patient education provided. [facility] provided [facility]. Spanish interpreter provided.   Breast and Cervical Cancer Risk Assessment: Patient has no family history of breast cancer, known genetic mutations, or radiation treatment to the chest before age 46. Patient has no history of cervical dysplasia, immunocompromised, or DES exposure in-utero. Patient has a 5-year risk for breast cancer at 0.5% and a lifetime risk at 6%.  Used Spanish interpreter Celanese CorporationErika McReynolds from [employer].

## 2017-08-13 NOTE — Patient Instructions (Addendum)
Explained breast self awareness with Amber Horn Patient did not need a Pap smear today due to last Pap smear and HPV typing was 03/29/2015. Let her know BCCCP will cover Pap smears and HPV typing every 5 years unless has a history of abnormal Pap smears. [facility] mamm[facility]. Appointment scheduled for Thursday, August 13, 2017 at 0910. Let patient know the Breast Center will follow up with her within the next couple weeks with results of mammogram by letter or phone. Amber Horn verbalized understanding.  [name], Amber Maserhristine Poll, RN 9:15 AM

## 2017-09-25 ENCOUNTER — Telehealth (HOSPITAL_COMMUNITY): Payer: Self-pay | Admitting: *Deleted

## 2017-09-25 NOTE — Telephone Encounter (Signed)
   Health Coaching 3  Spanish Interpreter- Delorise RoyalsJulie Sowell  Goals- patient states that she is working on her goals of eating more fruits and vegetables and drinking more water.  She is doing Psychologist, educationalZumba everyday.  Navigation:   Patient is aware of 1 more health coaching session and a follow up.     Time - 10 minutes

## 2018-04-12 ENCOUNTER — Other Ambulatory Visit (HOSPITAL_COMMUNITY): Payer: Self-pay | Admitting: *Deleted

## 2018-04-12 DIAGNOSIS — Z1231 Encounter for screening mammogram for malignant neoplasm of breast: Secondary | ICD-10-CM

## 2018-05-03 ENCOUNTER — Other Ambulatory Visit: Payer: Self-pay | Admitting: Nurse Practitioner

## 2018-05-03 DIAGNOSIS — Z124 Encounter for screening for malignant neoplasm of cervix: Secondary | ICD-10-CM

## 2018-05-03 DIAGNOSIS — N939 Abnormal uterine and vaginal bleeding, unspecified: Secondary | ICD-10-CM

## 2018-05-03 NOTE — Progress Notes (Signed)
Patient: Amber Horn           Date of Birth: February 08, 1971           MRN: [id number] Visit Date: 05/03/2018 PCP: Amber Angst, MD     Cervical Exam Pap smear completed: Pap test Abnormal Observations: Very small os.  Small amount of clear yellow discharge.  Client reports vaginal bleeding heavy and now is every 20 days instead of every 28 days. Recommendations: Follow up pap according to guidelines. Refer client to [facility] [employer] and financial counselor to have abnormal bleeding evaluated.      Patient's History Patient Active Problem List   Diagnosis Date Noted  . Unspecified constipation 02/21/2013  . Menorrhagia 02/21/2013   No past medical history on file.  Family History  Problem Relation Age of Onset  . Cancer Brother   . Diabetes Mother   . Hypertension Father   . Breast cancer Neg Hx     Past Surgical History:  Procedure Laterality Date  . TUBAL LIGATION     Social History   Occupational History  . Not on file  Tobacco Use  . Smoking status: Never Smoker  . Smokeless tobacco: Never Used  Substance and Sexual Activity  . Alcohol use: No  . Drug use: No  . Sexual activity: Yes    Birth control/protection: Surgical

## 2018-05-03 NOTE — Addendum Note (Signed)
Addended by: Lynnell Dike on: 05/03/2018 08:43 PM   Modules accepted: Orders

## 2018-05-06 LAB — CYTOLOGY - PAP: Diagnosis: NEGATIVE

## 2018-06-08 ENCOUNTER — Telehealth (HOSPITAL_COMMUNITY): Payer: Self-pay | Admitting: *Deleted

## 2018-06-08 NOTE — Telephone Encounter (Signed)
Called patient with Spanish interpreter Natale Lay from [facility] to discuss her cervical cancer screening results. Explained to patient that her Pap smear was normal. Patient complained of AUB and follow-up is recommended. Patient has no insurance. Offered to refer patient to the [facility] [employer]. Told patient about the [facility] Financial assistance application. Patient would like a referral and verbalized understanding. Referral sent to the [facility] [employer].

## 2018-08-17 ENCOUNTER — Ambulatory Visit
Admission: RE | Admit: 2018-08-17 | Discharge: 2018-08-17 | Disposition: A | Discharge status: Home / Self Care | Payer: Self-pay | Source: Ambulatory Visit | Attending: Obstetrics and Gynecology | Admitting: Obstetrics and Gynecology

## 2018-08-17 ENCOUNTER — Encounter (HOSPITAL_COMMUNITY): Payer: Self-pay

## 2018-08-17 ENCOUNTER — Other Ambulatory Visit: Payer: Self-pay

## 2018-08-17 ENCOUNTER — Ambulatory Visit (HOSPITAL_COMMUNITY)
Admission: RE | Admit: 2018-08-17 | Discharge: 2018-08-17 | Disposition: A | Discharge status: Home / Self Care | Payer: No Typology Code available for payment source | Source: Ambulatory Visit | Attending: Obstetrics and Gynecology | Admitting: Obstetrics and Gynecology

## 2018-08-17 VITALS — BP 120/84 | Temp 98.3°F | Wt 140.0 lb

## 2018-08-17 DIAGNOSIS — Z1231 Encounter for screening mammogram for malignant neoplasm of breast: Secondary | ICD-10-CM

## 2018-08-17 DIAGNOSIS — Z1239 Encounter for other screening for malignant neoplasm of breast: Secondary | ICD-10-CM

## 2018-08-17 NOTE — Progress Notes (Signed)
No complaints today.   Pap Smear: Pap smear not completed today. Last Pap smear was 05/03/2018 at the free cervical cancer at the [facility] and normal. Per patient has no history of an abnormal Pap smear. Last two Pap smear results are in Epic.  Physical exam: Breasts Breasts symmetrical. No skin abnormalities bilateral breasts. No nipple retraction bilateral breasts. No nipple discharge bilateral breasts. No lymphadenopathy. No lumps palpated bilateral breasts. No complaints of pain or tenderness on exam. Referred patient to the [place] for a screening mammogram. Appointment scheduled for Tuesday, August 17, 2018 at [place].        Pelvic/Bimanual No Pap smear completed today since last Pap smear was 05/03/2018. Pap smear not indicated per BCCCP guidelines.   Smoking History: Patient has never smoked.  Patient Navigation: Patient education provided. Access to services provided for patient through [facility] program. Spanish interpreter provided.   Breast and Cervical Cancer Risk Assessment: Patient has no family history of breast cancer, known genetic mutations, or radiation treatment to the chest before age 10. Patient has no history of cervical dysplasia, immunocompromised, or DES exposure in-utero.  Risk Assessment    No risk assessment data for the current encounter   Risk Scores      08/13/2017   Last edited by: Armond Hang, LPN   5-year risk: 0.5 %   Lifetime risk: 6 %         Used Spanish interpreter Rudene Anda from [place].

## 2018-08-17 NOTE — Patient Instructions (Signed)
Explained breast self awareness with Netty Starring. Patient did not need a Pap smear today due to last Pap smear was 05/03/2018. Let her know BCCCP will cover Pap smears every 3 years unless has a history of abnormal Pap smears. Referred patient to the [place] for a screening mammogram. Appointment scheduled for Tuesday, August 17, 2018 at [place]. Patient aware of appointment and will be there. Let patient know the Breast Center will follow up with her within the next couple weeks with results of mammogram by letter or phone. Netty Starring verbalized understanding.  Rianne Degraaf, Arvil Chaco, RN 8:07 AM

## 2018-08-18 ENCOUNTER — Encounter (HOSPITAL_COMMUNITY): Payer: Self-pay | Admitting: *Deleted

## 2018-12-01 ENCOUNTER — Other Ambulatory Visit: Payer: Self-pay

## 2018-12-01 DIAGNOSIS — Z20822 Contact with and (suspected) exposure to covid-19: Secondary | ICD-10-CM

## 2018-12-03 ENCOUNTER — Telehealth: Payer: Self-pay | Admitting: Critical Care Medicine

## 2018-12-03 LAB — NOVEL CORONAVIRUS, NAA: SARS-CoV-2, NAA: DETECTED — AB

## 2018-12-03 NOTE — Telephone Encounter (Signed)
I connected with this patient who tested positive for COVID September 23.  She has been symptomatic since September 19, she has had body aches fever sore throat loss of taste.  She is now improving.  She knows she will need to stay in isolation until September 30.  She knows the health department may contact her.  She acquired the virus from her husband who is positive.

## 2019-06-10 ENCOUNTER — Ambulatory Visit: Payer: Self-pay | Attending: Internal Medicine

## 2019-06-10 DIAGNOSIS — Z23 Encounter for immunization: Secondary | ICD-10-CM

## 2019-06-10 NOTE — Progress Notes (Signed)
   Covid-19 Vaccination Clinic  Name:  Ermel Verne    MRN: [id number] DOB: 1970-12-18  06/10/2019  Ms. Santee was observed post Covid-19 immunization for 15 minutes without incident. She was provided with Vaccine Information Sheet and instruction to access the V-Safe system.   Ms. Rydberg was instructed to call 911 with any severe reactions post vaccine: Marland Kitchen Difficulty breathing  . Swelling of face and throat  . A fast heartbeat  . A bad rash all over body  . Dizziness and weakness   Immunizations Administered    Name Date Dose VIS Date Route   Pfizer COVID-19 Vaccine 06/10/2019  2:15 PM 0.3 mL 02/18/2019 Intramuscular   Manufacturer: [employer], [employer]   Lot: [id number]   NDC: [address]-7

## 2019-07-04 ENCOUNTER — Ambulatory Visit: Payer: No Typology Code available for payment source

## 2019-07-05 ENCOUNTER — Ambulatory Visit: Payer: No Typology Code available for payment source | Attending: Internal Medicine

## 2019-07-05 DIAGNOSIS — Z23 Encounter for immunization: Secondary | ICD-10-CM

## 2019-07-05 NOTE — Progress Notes (Signed)
   Covid-19 Vaccination Clinic  Name:  Amber Horn    MRN: [id number] DOB: 19-Oct-1970  07/05/2019  [name] was observed post Covid-19 immunization for 15 minutes without incident. She was provided with Vaccine Information Sheet and instruction to access the V-Safe system.   [name] was instructed to call 911 with any severe reactions post vaccine: Marland Kitchen Difficulty breathing  . Swelling of face and throat  . A fast heartbeat  . A bad rash all over body  . Dizziness and weakness   Immunizations Administered    Name Date Dose VIS Date Route   Pfizer COVID-19 Vaccine 07/05/2019  8:49 AM 0.3 mL 05/04/2018 Intramuscular   Manufacturer: [employer], [employer]   Lot: [id number]   NDC: [address]-7

## 2021-03-19 ENCOUNTER — Other Ambulatory Visit: Payer: Self-pay

## 2021-03-19 DIAGNOSIS — Z1231 Encounter for screening mammogram for malignant neoplasm of breast: Secondary | ICD-10-CM

## 2021-03-22 ENCOUNTER — Ambulatory Visit: Payer: No Typology Code available for payment source

## 2021-04-02 ENCOUNTER — Other Ambulatory Visit: Payer: Self-pay

## 2021-04-02 ENCOUNTER — Encounter: Payer: Self-pay | Admitting: Internal Medicine

## 2021-04-02 ENCOUNTER — Ambulatory Visit: Payer: Self-pay | Admitting: Internal Medicine

## 2021-04-02 DIAGNOSIS — Z0000 Encounter for general adult medical examination without abnormal findings: Secondary | ICD-10-CM | POA: Insufficient documentation

## 2021-04-02 DIAGNOSIS — K219 Gastro-esophageal reflux disease without esophagitis: Secondary | ICD-10-CM

## 2021-04-02 DIAGNOSIS — Z87898 Personal history of other specified conditions: Secondary | ICD-10-CM

## 2021-04-02 DIAGNOSIS — K5900 Constipation, unspecified: Secondary | ICD-10-CM

## 2021-04-02 DIAGNOSIS — E119 Type 2 diabetes mellitus without complications: Secondary | ICD-10-CM | POA: Insufficient documentation

## 2021-04-02 NOTE — Assessment & Plan Note (Addendum)
Patient endorses history of prediabetes.  Last hemoglobin A1c 5.9% in 2019.  Last lipid panel in 2019 with LDL of 118.  Patient has previously received free screening outside of our clinic, possibly through wisewoman.  Given that she is self-pay, patient would benefit from referral back to [facility] for continued Pap smears, vaccinations, lab work testing.  Patient was provided with [facility] wisewoman program information in Spanish today.  I have also reached out to [facility] point-of-care individual with [facility] who has already called patient and set up an appointment for 05/15/2021 for repeat labwork.  She already has an appointment for her next mammogram March 2023 (has no personal history of breast cancer or breast mass, has no family history of breast cancer, though is requesting screening for reassurance).  Patient encouraged to return to clinic as needed in the event that she has an abnormal lab value or screening test that requires further work-up or medical management.

## 2021-04-02 NOTE — Progress Notes (Signed)
°  CC: establish care  HPI:  [name] is a 51 y.o. female with a past medical history stated below and presents today to establish care. Please see problem based assessment and plan for additional details.  Past Medical History:  Diagnosis Date   History of prediabetes     Current Outpatient Medications on File Prior to Visit  Medication Sig Dispense Refill   omeprazole (PRILOSEC OTC) 20 MG tablet Take 20 mg by mouth daily.     No current facility-administered medications on file prior to visit.    Family History  Problem Relation Age of Onset   Cancer Brother    Diabetes Mother    Hypertension Father    Breast cancer Neg Hx     Social History: Patient lives in [place] with her spouse and 75 year old son.  She has 2 older daughters who have moved out of the home.  She is not currently working.  Not currently dieting.  Walks or does number for exercise once a week.  Denies alcohol use, tobacco use, illicit drug use.  Review of Systems: ROS negative except for what is noted on the assessment and plan.  Vitals:   04/02/21 0902  BP: 130/75  Pulse: 84  Resp: (!) 24  Temp: 98.2 F (36.8 C)  TempSrc: Oral  SpO2: 100%  Weight: 146 lb 6.4 oz (66.4 kg)  Height: 4' 10.5" (1.486 m)     Physical Exam: General: Well appearing Latin American female, NAD HENT: normocephalic, atraumatic EYES: conjunctiva non-erythematous, no scleral icterus CV: regular rate, normal rhythm, no murmurs, rubs, gallops.  No lower extremity edema. Pulmonary: normal work of breathing on RA, lungs clear to auscultation, no rales, wheezes, rhonchi Abdominal: non-distended, soft, non-tender to palpation, normal BS Skin: Warm and dry, no rashes or lesions Neurological: MS: awake, alert and oriented x3, normal speech and fund of knowledge Motor: moves all extremities antigravity Psych: normal affect    Assessment & Plan:   See Encounters Tab for problem based  charting.  Patient discussed with Dr. Gwendalyn Ege, M.D. [facility] Internal Medicine, PGY-1 Pager: [phone] Date 04/02/2021 Time 12:51 PM

## 2021-04-02 NOTE — Patient Instructions (Addendum)
Marry Guan por permitirnos brindarle su atencin hoy. Hoy discutimos:  Reflujo cido esofgico: Para su dolor ardiente, contine tomando Prilosec. Tambin puede tomar Tums o Pepto-Bismol, lo que le resulte til. Est bien tomarlos para el dolor ardiente si es necesario. Estos tienden a ser [employer]. Es importante prevenir Chief Technology Officer ardiente con el Stella, ya que esto puede daar el esfago.  Estreimiento: Contine comiendo alimentos ricos en fibra o tomando un suplemento de [place]. Tambin puede probar MiraLAX, que es un polvo soluble que puede recoger en su Arboriculturist. Es seguro tomar 1 cucharada una vez al da o 1 cucharada una vez por la maana una vez por la noche. Este es un suplemento muy suave que puede ayudarlo a tener evacuaciones intestinales ms regulares.  Prediabetes: programe una cita para completar el anlisis de laboratorio a travs del [address] Si alguna de sus pruebas de laboratorio resulta anormal, siempre puede regresar a Technical sales engineer y podemos evaluar para ver si necesita comenzar con un medicamento.   Acceso a mi grfico: [email or web address]  Por favor, haga un seguimiento segn sea necesario.  Asegrese de llegar 15 minutos antes de su prxima cita. Si llega tarde, es posible que se Armed forces operational officer.  Esperamos verte la prxima vez. Llame a nuestra clnica al [phone] si tiene alguna pregunta o inquietud. El mejor horario para llamar es de lunes a viernes de 9 a. m. a 4 p. m., pero hay alguien disponible las 24 horas del da, los 7 [address]  [address] de la [place]. Si es fuera del horario de atencin o durante el fin de [place], llame al nmero principal del hospital y pregunte por el residente de guardia de medicina interna. Si necesita reposicin de medicamentos, por favor notifique a su farmacia con una semana de anticipacin y ellos nos enviarn una solicitud.  Gracias por  dejarnos participar en su cuidado. Deseandote lo mejor!  Dra. Ellison Carwin 24/03/2021, 10:00 Residente MI, PGY-1

## 2021-04-02 NOTE — Assessment & Plan Note (Addendum)
Patient reports history of constipation.  Currently is having 1 bowel movement per day however is straining to have BM, endorses intermittent stomach pain and bloating which improves after having a BM.  Endorses hard stools.  She is managing her constipation by consuming oatmeal, papaya, prunes.  Benign abdominal exam.  On assessment, patient is having mild constipation that is not relieved with increased fiber intake.  Discussed use of fiber supplement and/or addition of MiraLAX or other over-the-counter stool softeners to use as needed for constipation.

## 2021-04-03 NOTE — Addendum Note (Signed)
Addended by: Erlinda Hong T on: 04/03/2021 08:57 AM   Modules accepted: Level of Service

## 2021-04-03 NOTE — Progress Notes (Signed)
Internal Medicine Clinic Attending ? ?Case discussed with Dr. Zinoviev  At the time of the visit.  We reviewed the resident?s history and exam and pertinent patient test results.  I agree with the assessment, diagnosis, and plan of care documented in the resident?s note.  ?

## 2021-05-09 ENCOUNTER — Ambulatory Visit: Payer: Self-pay | Admitting: *Deleted

## 2021-05-09 ENCOUNTER — Other Ambulatory Visit: Payer: Self-pay

## 2021-05-09 ENCOUNTER — Encounter (INDEPENDENT_AMBULATORY_CARE_PROVIDER_SITE_OTHER): Payer: Self-pay

## 2021-05-09 ENCOUNTER — Ambulatory Visit
Admission: RE | Admit: 2021-05-09 | Discharge: 2021-05-09 | Disposition: A | Discharge status: Home / Self Care | Payer: No Typology Code available for payment source | Source: Ambulatory Visit | Attending: Obstetrics and Gynecology | Admitting: Obstetrics and Gynecology

## 2021-05-09 VITALS — BP 120/72 | Wt 147.8 lb

## 2021-05-09 DIAGNOSIS — Z1231 Encounter for screening mammogram for malignant neoplasm of breast: Secondary | ICD-10-CM

## 2021-05-09 DIAGNOSIS — Z1211 Encounter for screening for malignant neoplasm of colon: Secondary | ICD-10-CM

## 2021-05-09 DIAGNOSIS — Z01419 Encounter for gynecological examination (general) (routine) without abnormal findings: Secondary | ICD-10-CM

## 2021-05-09 NOTE — Progress Notes (Signed)
Amber Horn is a 51 y.o. [phone] female who presents to [facility] clinic today with no complaints.  ?  ?Pap Smear: Pap smear completed today. Last Pap smear was 05/03/2018 at the free cervical cancer at the [facility] and normal. Per patient has no history of an abnormal Pap smear. Last two Pap smear results are in Epic. ?  ?Physical exam: ?Breasts ?Breasts symmetrical. No skin abnormalities bilateral breasts. No nipple retraction bilateral breasts. No nipple discharge bilateral breasts. No lymphadenopathy. No lumps palpated bilateral breasts. No complaints of pain or tenderness on exam. ? ?MS DIGITAL SCREENING TOMO BILATERAL ? ?Result Date: 08/17/2018 ?CLINICAL DATA:  Screening. EXAM: DIGITAL SCREENING BILATERAL MAMMOGRAM WITH TOMO AND CAD COMPARISON:  Previous exam(s). ACR Breast Density Category c: The breast tissue is heterogeneously dense, which may obscure small masses. FINDINGS: There are no findings suspicious for malignancy. Images were processed with CAD. IMPRESSION: No mammographic evidence of malignancy. A result letter of this screening mammogram will be mailed directly to the patient. RECOMMENDATION: Screening mammogram in one year. (Code:SM-B-01Y) BI-RADS CATEGORY  1: Negative. Electronically Signed   By: Fidela Salisbury M.D.   On: 08/17/2018 11:11  ? ?MS DIGITAL SCREENING TOMO BILATERAL ? ?Result Date: 08/13/2017 ?CLINICAL DATA:  Screening. EXAM: DIGITAL SCREENING BILATERAL MAMMOGRAM WITH TOMO AND CAD COMPARISON:  Previous exam(s). ACR Breast Density Category c: The breast tissue is heterogeneously dense, which may obscure small masses. FINDINGS: There are no findings suspicious for malignancy. Images were processed with CAD. IMPRESSION: No mammographic evidence of malignancy. A result letter of this screening mammogram will be mailed directly to the patient. RECOMMENDATION: Screening mammogram in one year. (Code:SM-B-01Y) BI-RADS CATEGORY  1: Negative. Electronically  Signed   By: Fidela Salisbury M.D.   On: 08/13/2017 12:46   ?      ?Pelvic/Bimanual ?Ext Genitalia ?No lesions, no swelling and no discharge observed on external genitalia.      ?  ?Vagina ?Vagina pink and normal texture. No lesions or discharge observed in vagina.      ?  ?Cervix ?Cervix is present. Cervix pink and of normal texture. No discharge observed.  ?  ?Uterus ?Uterus is present and palpable. Uterus in normal position and normal size.      ?  ?Adnexae ?Bilateral ovaries present and palpable. No tenderness on palpation.       ?  ?Rectovaginal ?No rectal exam completed today since patient had no rectal complaints. No skin abnormalities observed on exam.   ?  ?Smoking History: ?Patient has never smoked. ?  ?Patient Navigation: ?Patient education provided. Access to services provided for patient through [place] program. Spanish interpreter Edwinna Areola from [facility] provided.  ? ?Colorectal Cancer Screening: ?Per patient has never had colonoscopy completed. FIT Test given to patient to complete. No complaints today.  ?  ?Breast and Cervical Cancer Risk Assessment: ?Patient does not have family history of breast cancer, known genetic mutations, or radiation treatment to the chest before age 28. Patient does not have history of cervical dysplasia, immunocompromised, or DES exposure in-utero. ? ?Risk Assessment   ? ? Risk Scores   ? ?   05/09/2021 08/13/2017  ? Last edited by: Demetrius Revel, LPN Armond Hang, LPN  ? 5-year risk: 0.6 % 0.5 %  ? Lifetime risk: 5.7 % 6 %  ? ?  ?  ? ?  ? ? ?A: ?B[facility]CP exam with pap smear ?No complaints. ? ?P: ?Referred patient to the [place]  for a screening mammogram on mobile unit. Appointment scheduled Thursday, May 09, 2021 at [place]. ? ?Loletta Parish, RN ?05/09/2021 8:47 AM   ?

## 2021-05-09 NOTE — Patient Instructions (Signed)
Explained breast self awareness with Amber Horn. Pap smear completed today. Let her know BCCCP will cover Pap smears and HPV typing every 5 years unless has a history of abnormal Pap smears. Referred patient to the [facility] for a screening mammogram on mobile unit. Appointment scheduled Thursday, May 09, 2021 at 0940. Patient escorted to the mobile unit following BCCCP appointment for her screening mammogram. Let patient know will follow up with her within the next couple weeks with results of Pap smear by phone. Informed patient that the Breast Center will follow up with her within the next couple of weeks with results of her mammogram by letter or phone. Amber Horn verbalized understanding. ? ?[name], Amber Maser, RN ?8:47 AM ? ? ? ? ?

## 2021-05-10 LAB — CYTOLOGY - PAP
Comment: NEGATIVE
Diagnosis: NEGATIVE
High risk HPV: NEGATIVE

## 2021-05-13 ENCOUNTER — Telehealth: Payer: Self-pay

## 2021-05-13 NOTE — Telephone Encounter (Signed)
Called patient via [employer] #[phone] to give pap smear results. Informed patient that pap smear was normal and HPV was negative. Based on this result her next pap smear will be due in 5 years. Patient voiced understanding.  ?

## 2021-05-15 ENCOUNTER — Inpatient Hospital Stay: Payer: Self-pay | Attending: Obstetrics and Gynecology | Admitting: *Deleted

## 2021-05-15 ENCOUNTER — Other Ambulatory Visit: Payer: Self-pay

## 2021-05-15 VITALS — BP 130/84 | Ht 58.5 in | Wt 147.6 lb

## 2021-05-15 DIAGNOSIS — Z0000 Encounter for general adult medical examination without abnormal findings: Secondary | ICD-10-CM

## 2021-05-15 NOTE — Progress Notes (Signed)
W[facility]woman initial screening ?  ?Interpreter- Natale Lay, [employer] ?  ?Clinical Measurement:  ?Vitals:  ? 05/15/21 0832  ?BP: 122/88  ? Fasting Labs Drawn Today, will review with patient when they result. ?  ?Medical History:  Patient states that she  does not know if she has  high cholesterol,  does not know if she has  high blood pressure and she  does not know if she has  diabetes. ? ?Medications:  Patient states that she does not take medication to lower cholesterol, blood pressure or blood sugar.  Patient does not take an aspirin a day to help prevent a heart attack or stroke.  ?  ?Blood pressure, self measurement: Patient states that she does not measure blood pressure from home. She checks her blood pressure N/A. She shares her readings with a health care provider: N/A. ?  ?Nutrition: Patient states that on average she eats 0 cups of fruit and 0 cups of vegetables per day. Patient states that she does not eat fish at least 2 times per week. Patient eats less than half servings of whole grains. Patient drinks less than 36 ounces of beverages with added sugar weekly: yes. Patient is currently watching sodium or salt intake: yes. In the past 7 days patient has consumed drinks containing alcohol on 0 days. On a day that patient consumes drinks containing alcohol on average 0 drinks are consumed.     ? ?Physical activity:  Patient states that she gets 0 minutes of moderate and 0 minutes of vigorous physical activity each week. ? ?Smoking status:  Patient states that she has has never smoked . ?  ?Quality of life:  Over the past 2 weeks patient states that she had little interest or pleasure in doing things: several days. She has been feeling down, depressed or hopeless:several days.  ?  ?Risk reduction and counseling:  ? ?Health Coaching: Spoke with the patient about the daily recommendation for fruits and vegetables (2 cups fruit and 3 cups vegetables). Showed patient what a serving size would look like. Spoke  with patient about adding heart healthy fish into diet. Gave examples of heart healthy fish that patient can try such as: salmon, tuna, mackerel, sardines, sea bass and trout. Spoke with patient about adding more whole grains in diet. Patient currently consumes oatmeal regularly but no other whole grains. Gave examples of other whole grains that patient can try incorporating into diet. Encouraged patient to also continue watching sweet and sugar intake as well as carbs due to hx of elevated glucose and HbA1C. See goal below that patient would like to work on in regards to exercise. ? ?Goal: Patient would like to start exercising again. Patient has worked out in the past but has not over the past several years. Patient would like to start doing zumba classes again. Patient would like to try and complete 3 zumba classes a week over the next 3 months.  ?  ?Navigation:  I will notify patient of lab results.  Patient is aware of 2 more health coaching sessions and a follow up. ? ?Time: 25 minutes  ?

## 2021-05-16 LAB — LIPID PANEL
Chol/HDL Ratio: 3.6 ratio (ref 0.0–4.4)
Cholesterol, Total: 200 mg/dL — ABNORMAL HIGH (ref 100–199)
HDL: 56 mg/dL (ref >39)
LDL Chol Calc (NIH): 120 mg/dL — ABNORMAL HIGH (ref 0–99)
Triglycerides: 137 mg/dL (ref 0–149)
VLDL Cholesterol Cal: 24 mg/dL (ref 5–40)

## 2021-05-16 LAB — GLUCOSE, RANDOM: Glucose: 106 mg/dL — ABNORMAL HIGH (ref 70–99)

## 2021-05-16 LAB — HEMOGLOBIN A1C
Est. average glucose Bld gHb Est-mCnc: 143 mg/dL
Hgb A1c MFr Bld: 6.6 % — ABNORMAL HIGH (ref 4.8–5.6)

## 2021-05-20 ENCOUNTER — Telehealth: Payer: Self-pay

## 2021-05-20 NOTE — Telephone Encounter (Addendum)
Health coaching 2 ?  ?interpreter- Alene Mires, [employer] ?  ?Labs- 200 cholesterol, 120 LDL cholesterol, 137 triglycerides, 56 HDL cholesterol, 6.6 hemoglobin A1C, 106 mean plasma glucose. Patient understands and is aware of her lab results. ?  ?Goals-  1. Spoke with patient about practicing more heart healthy diet which would include the following: reducing the amount of fried and fatty foods consumed, reducing the amount of red meats consumed, increasing lean protein intake such as chicken and fish, reducing the amount of whole fat dairy products consumed, increasing fruit and vegetable intake and increasing whole grain intake.  ?2. Decrease the amount of sweets and sugar consumed in both foods and drinks. ?3. Decrease the amount of carbs consumed. Spoke with patient about reducing the amount of bread, tortillas, rice, potatoes and pasta. ?4. Start exercising for at least 20-30 minutes a day. ? ?Navigation:  Patient is aware of 1 more health coaching sessions and a follow up. Patient is scheduled for follow-up appointment with Internal Medicine on May 22, 2021 @ 2:15 pm for elevated labs. ? ?Time-  11 minutes ?

## 2021-05-22 ENCOUNTER — Encounter: Payer: No Typology Code available for payment source | Admitting: Internal Medicine

## 2021-11-25 ENCOUNTER — Telehealth: Payer: Self-pay

## 2021-11-25 NOTE — Telephone Encounter (Signed)
Health Coaching 3  interpreter- Rudene Anda, [facility]   Goals- Patient has not made any changes with diet or exercise.    New goal- Patient does not want to set new goal at this time. Will check in with patient again during FU visit.   Barrier to reaching goal-    Strategies to overcome-    Navigation:  Patient is aware of  a follow up session. Patient is scheduled for FU on Monday, January 06, 2022 @ 9:15 am.    Time-  10 minutes

## 2022-01-06 ENCOUNTER — Inpatient Hospital Stay: Payer: No Typology Code available for payment source | Attending: Obstetrics and Gynecology | Admitting: *Deleted

## 2022-01-06 VITALS — BP 122/80 | Ht 58.5 in | Wt 149.2 lb

## 2022-01-06 DIAGNOSIS — Z0000 Encounter for general adult medical examination without abnormal findings: Secondary | ICD-10-CM

## 2022-01-06 NOTE — Progress Notes (Signed)
W[facility]woman follow up   Interpreter: Rudene Anda, [employer]  Clinical Measurement:  There were no vitals filed for this visit.    Medical History:  Patient states that she  does not know if she has  high cholesterol, has high blood pressure and she  does not know if she has  diabetes.  Medications:  Patient states that she does not take medication to lower cholesterol, blood pressure and blood sugar.  Patient does not take an aspirin a day to help prevent a heart attack or stroke.    Blood pressure, self measurement: Patient states that she does not measure blood pressure from home. She checks her blood pressure N/A. She shares her readings with a health care provider: N/A.   Nutrition: Patient states that on average she eats 0 cups of fruit and 0 cups of vegetables per day. Patient states that she does not eat fish at least 2 times per week. Patient eats less than half servings of whole grains. Patient drinks less than 36 ounces of beverages with added sugar weekly: yes. Patient is currently watching sodium or salt intake: yes. In the past 7 days patient has had 0 drinks containing alcohol. On average patient drinks 0 drinks containing alcohol per day.      Physical activity:  Patient states that she gets 0 minutes of moderate and 0 minutes of vigorous physical activity each week.  Smoking status:  Patient states that she has has never smoked .   Quality of life:  Over the past 2 weeks patient states that she had little interest or pleasure in doing things: several days. She has been feeling down, depressed or hopeless:not at all.    Risk reduction and counseling:   Health Coaching: Spoke with patient about current diet and exercise habits. Patient knows that she should be rating better and exercising but has not been motivated recently. Patient states that she feels tired often and that's why she hasn't been exercising. Patient no-showed FU appointment with Internal Medicine after initial  screening. Encouraged patient that if she is referred for FU in the future that she goes. Spoke with patient about how her previous blood work was in the diabetes range. Encouraged patient to try and start watching the amount of sweet and sugars that she consumes as well as carbohydrates. Encouraged patient to try and start doing some form of daily exercise as well.   Navigation: This was the  follow up session for this patient, I will check up on her progress in the coming months.  Time: 20 minutes

## 2022-04-17 ENCOUNTER — Other Ambulatory Visit: Payer: Self-pay

## 2022-04-17 DIAGNOSIS — Z1231 Encounter for screening mammogram for malignant neoplasm of breast: Secondary | ICD-10-CM

## 2022-05-13 ENCOUNTER — Ambulatory Visit
Admission: RE | Admit: 2022-05-13 | Discharge: 2022-05-13 | Disposition: A | Discharge status: Home / Self Care | Payer: No Typology Code available for payment source | Source: Ambulatory Visit | Attending: Obstetrics and Gynecology | Admitting: Obstetrics and Gynecology

## 2022-05-13 ENCOUNTER — Ambulatory Visit: Payer: Self-pay | Admitting: *Deleted

## 2022-05-13 VITALS — BP 126/71 | Wt 147.0 lb

## 2022-05-13 DIAGNOSIS — Z1231 Encounter for screening mammogram for malignant neoplasm of breast: Secondary | ICD-10-CM

## 2022-05-13 DIAGNOSIS — Z1239 Encounter for other screening for malignant neoplasm of breast: Secondary | ICD-10-CM

## 2022-05-13 NOTE — Progress Notes (Signed)
[name] is a 52 y.o. female who presents to [facility] clinic today with no complaints.    Pap Smear: Pap smear not completed today. Last Pap smear was 05/09/2021 at [facility] clinic and was normal with negative HPV. Per patient has no history of an abnormal Pap smear. Last Pap smear result is available in Epic.   Physical exam: Breasts Breasts symmetrical. No skin abnormalities bilateral breasts. No nipple retraction bilateral breasts. No nipple discharge bilateral breasts. No lymphadenopathy. No lumps palpated bilateral breasts. No complaints of pain or tenderness on exam.       MS DIGITAL SCREENING TOMO BILATERAL  Result Date: 05/10/2021 CLINICAL DATA:  Screening. EXAM: DIGITAL SCREENING BILATERAL MAMMOGRAM WITH TOMOSYNTHESIS AND CAD TECHNIQUE: Bilateral screening digital craniocaudal and mediolateral oblique mammograms were obtained. Bilateral screening digital breast tomosynthesis was performed. The images were evaluated with computer-aided detection. COMPARISON:  Previous exam(s). ACR Breast Density Category c: The breast tissue is heterogeneously dense, which may obscure small masses. FINDINGS: There are no findings suspicious for malignancy. IMPRESSION: No mammographic evidence of malignancy. A result letter of this screening mammogram will be mailed directly to the patient. RECOMMENDATION: Screening mammogram in one year. (Code:[id number]-01Y) BI-RADS CATEGORY  1: Negative. Electronically Signed   By: Ammie Ferrier M.D.   On: 05/10/2021 09:08   MS DIGITAL SCREENING TOMO BILATERAL  Result Date: 08/17/2018 CLINICAL DATA:  Screening. EXAM: DIGITAL SCREENING BILATERAL MAMMOGRAM WITH TOMO AND CAD COMPARISON:  Previous exam(s). ACR Breast Density Category c: The breast tissue is heterogeneously dense, which may obscure small masses. FINDINGS: There are no findings suspicious for malignancy. Images were processed with CAD. IMPRESSION: No mammographic evidence of malignancy. A result letter  of this screening mammogram will be mailed directly to the patient. RECOMMENDATION: Screening mammogram in one year. (Code:[id number]-01Y) BI-RADS CATEGORY  1: Negative. Electronically Signed   By: Fidela Salisbury M.D.   On: 08/17/2018 11:11   MS DIGITAL SCREENING TOMO BILATERAL  Result Date: 08/13/2017 CLINICAL DATA:  Screening. EXAM: DIGITAL SCREENING BILATERAL MAMMOGRAM WITH TOMO AND CAD COMPARISON:  Previous exam(s). ACR Breast Density Category c: The breast tissue is heterogeneously dense, which may obscure small masses. FINDINGS: There are no findings suspicious for malignancy. Images were processed with CAD. IMPRESSION: No mammographic evidence of malignancy. A result letter of this screening mammogram will be mailed directly to the patient. RECOMMENDATION: Screening mammogram in one year. (Code:SM-B-01Y) BI-RADS CATEGORY  1: Negative. Electronically Signed   By: Fidela Salisbury M.D.   On: 08/13/2017 12:46    Pelvic/Bimanual Pap is not indicated today per BCCCP guidelines.   Smoking History: Patient has never smoked.    Patient Navigation: Patient education provided. Access to services provided for patient through [place] program. Spanish interpreter Rudene Anda from [facility] provided.   Colorectal Cancer Screening: Per patient has never had colonoscopy completed. FIT Test given to patient at last visit 05/09/2021 that was not returned. Explained the importance of colorectal cancer screenings and completing the FIT Test since she has not had a colonoscopy. FIT Test will be mailed to patient to complete. No complaints today.    Breast and Cervical Cancer Risk Assessment: Patient does not have family history of breast cancer, known genetic mutations, or radiation treatment to the chest before age 70. Patient does not have history of cervical dysplasia, immunocompromised, or DES exposure in-utero.  Risk Scores as of 05/13/2022     [name]           5-year 0.9 %   Lifetime  7.92 %   This  patient is His[place]/Latina but has no documented birth country, so the [facility] used data from [place] patients to calculate their risk score. Document a birth country in the Demographics activity for a more accurate score.         Last calculated by Amber Horn, CMA on 05/13/2022 at 10:22 AM        A: BCCCP exam without pap smear No complaints.  P: Referred patient to the [facility] for a screening mammogram on mobile unit. Appointment scheduled Tuesday, May 13, 2022 at 1050.   Amber Parish, RN 05/13/2022 10:26 AM

## 2022-05-13 NOTE — Patient Instructions (Signed)
Explained breast self awareness with Amber Horn. Patient did not need a Pap smear today due to last Pap smear and HPV typing was 05/09/2021. Let her know BCCCP will cover Pap smears and HPV typing every 5 years unless has a history of abnormal Pap smears. Referred patient to the [facility] for a screening mammogram on mobile unit. Appointment scheduled Tuesday, May 13, 2022 at 1050. Patient aware of appointment and will be there. Let patient know the [facility] will follow up with her within the next couple weeks with results of her mammogram by letter or phone. Amber Horn verbalized understanding.  Angelynn Lemus, Arvil Chaco, RN 10:26 AM

## 2022-06-11 ENCOUNTER — Ambulatory Visit: Payer: Self-pay

## 2022-06-11 ENCOUNTER — Other Ambulatory Visit: Payer: Self-pay

## 2022-07-08 IMAGING — MG MM DIGITAL SCREENING BILAT W/ TOMO AND CAD
6 of 10 series · 6 of 30 positions shown · non-contrast
Comparison: Previous exam(s).

CLINICAL DATA: Screening.

EXAM:
DIGITAL SCREENING BILATERAL MAMMOGRAM WITH TOMOSYNTHESIS AND CAD
TECHNIQUE: Bilateral screening digital craniocaudal and mediolateral oblique
mammograms were obtained. Bilateral screening digital breast
tomosynthesis was performed. The images were evaluated with
computer-aided detection.

[R MLO synth-2D]
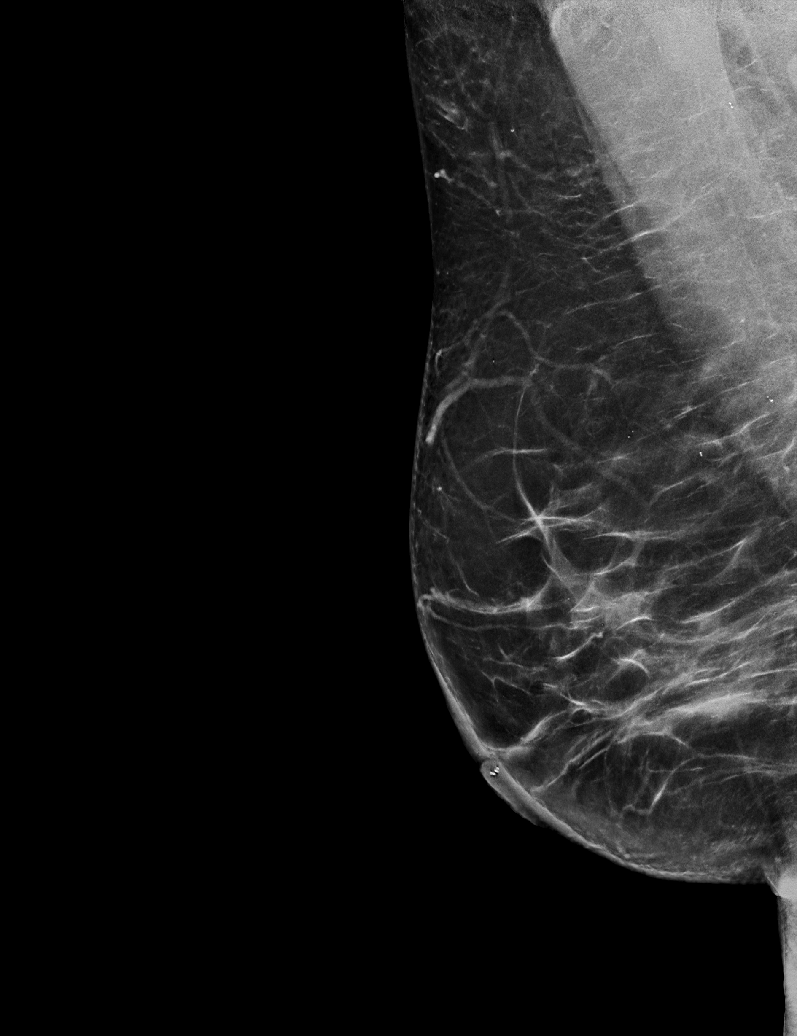

[R CC synth-2D]
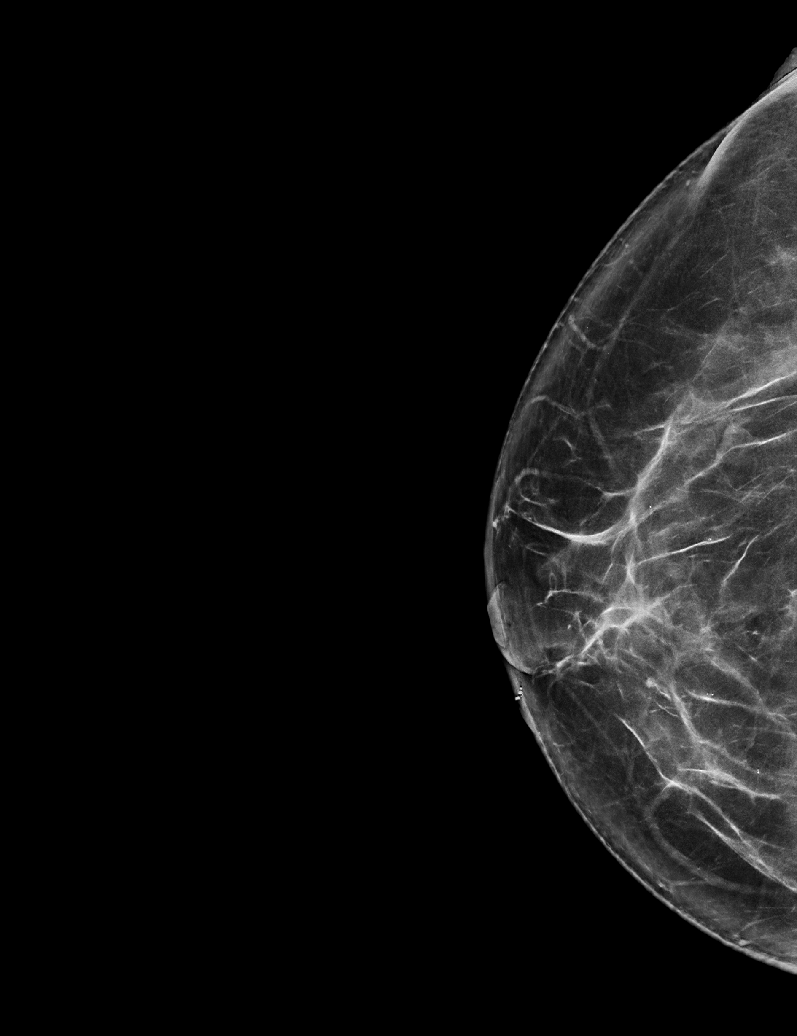

[L CC synth-2D]
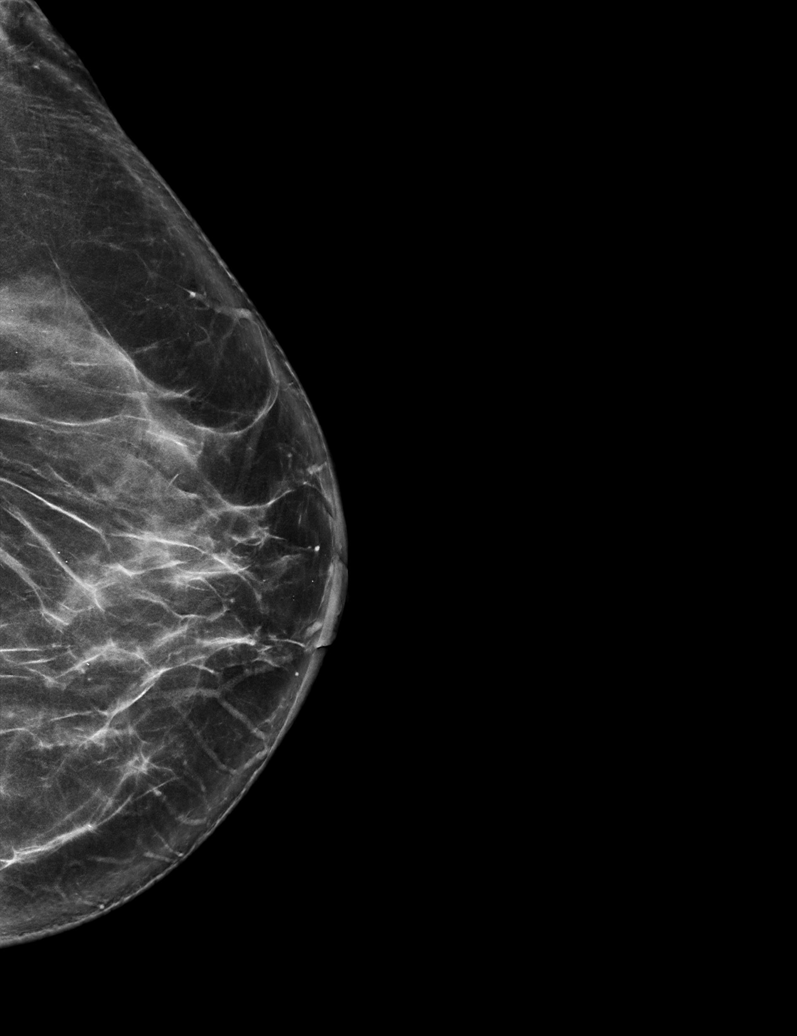

[L MLO synth-2D (1 of 2)]
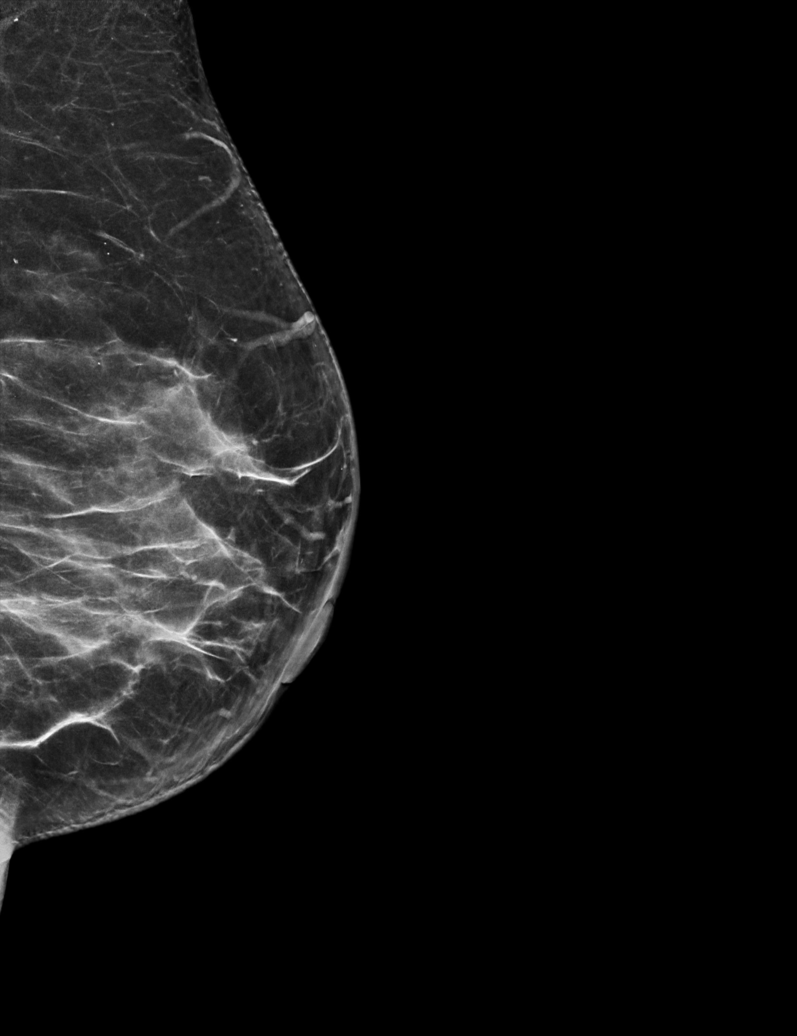

[L MLO synth-2D (2 of 2)]
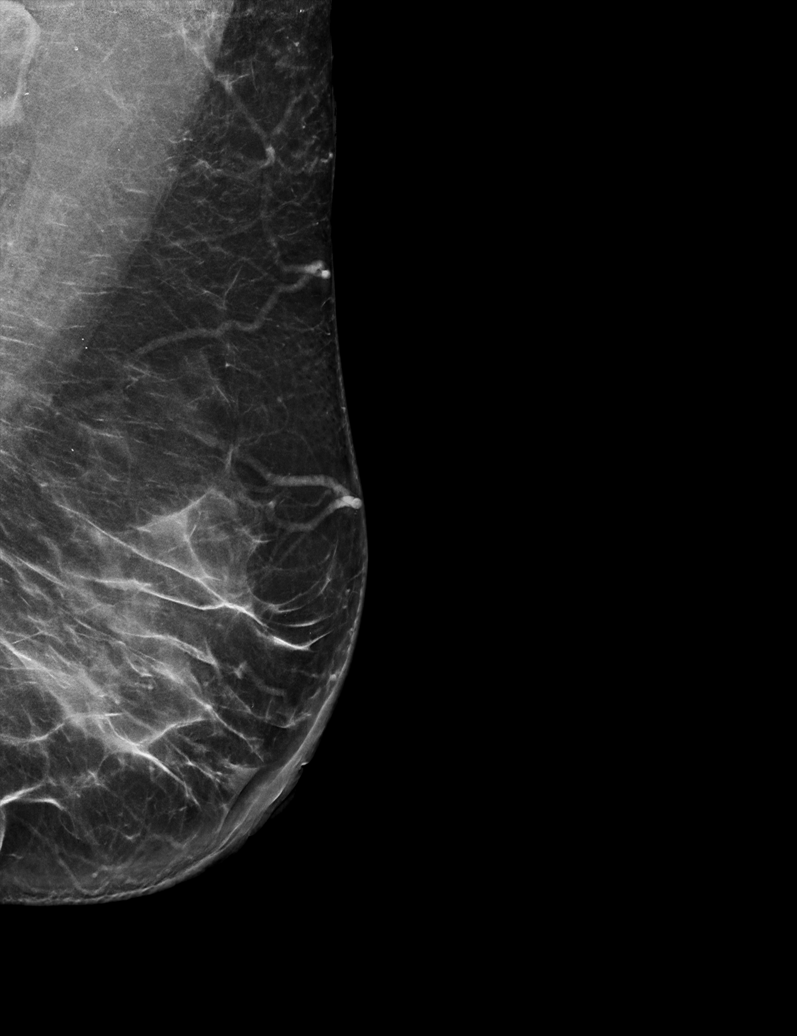

[R CC tomo · tomo slice 39/76.0]
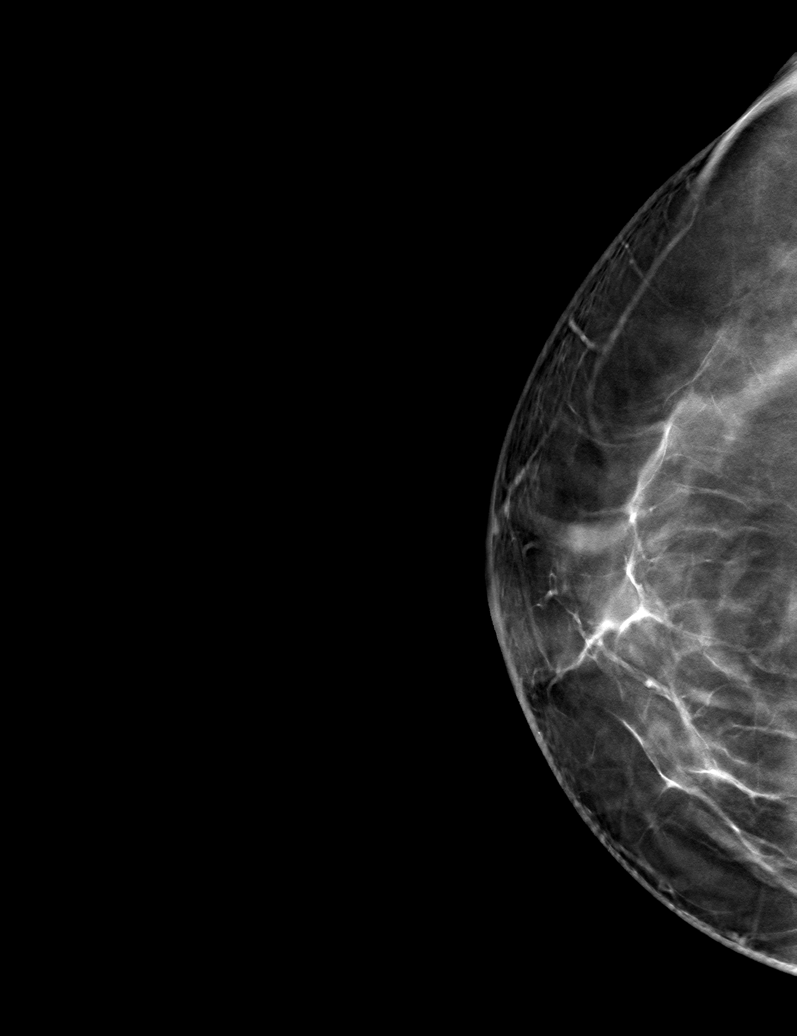

[6 of 30 positions shown; findings below may reference images not displayed]

ACR Breast Density Category c: The breast tissue is heterogeneously
dense, which may obscure small masses.
FINDINGS: There are no findings suspicious for malignancy.
IMPRESSION: No mammographic evidence of malignancy. A result letter of this
screening mammogram will be mailed directly to the patient.

RECOMMENDATION:
Screening mammogram in one year. (Code:[id number])

BI-RADS CATEGORY  1: Negative.

## 2022-07-16 ENCOUNTER — Other Ambulatory Visit: Payer: Self-pay

## 2022-07-16 ENCOUNTER — Inpatient Hospital Stay: Payer: No Typology Code available for payment source | Attending: Obstetrics and Gynecology | Admitting: *Deleted

## 2022-07-16 VITALS — BP 118/80 | Ht 58.5 in | Wt 147.8 lb

## 2022-07-16 DIAGNOSIS — Z0000 Encounter for general adult medical examination without abnormal findings: Secondary | ICD-10-CM

## 2022-07-16 NOTE — Progress Notes (Signed)
[facility] initial screening   Interpreter- Natale Lay, [employer]   Clinical Measurement: There were no vitals filed for this visit. Fasting Labs Drawn Today, will review with patient when they result.   Medical History: Patient states that she does not know if she has high cholesterol, has high blood pressure and she does not know if she has diabetes.  Medications: Patient states that she does not take medication to lower cholesterol, blood pressure or blood sugar.  Patient does not take an aspirin a day to help prevent a heart attack or stroke.    Blood pressure, self measurement: Patient states that she does not measure blood pressure from home. She checks her blood pressure N/A. She shares her readings with a health care provider: N/A.   Nutrition: Patient states that on average she eats 0 cups of fruit and 0 cups of vegetables per day. Patient states that she does not eat fish at least 2 times per week. Patient eats less than half servings of whole grains. Patient drinks less than 36 ounces of beverages with added sugar weekly: yes. Patient is currently watching sodium or salt intake: yes. In the past 7 days patient has consumed drinks containing alcohol on 0 days. On a day that patient consumes drinks containing alcohol on average 0 drinks are consumed.      Physical activity: Patient states that she gets 0 minutes of moderate and 0 minutes of vigorous physical activity each week.  Smoking status: Patient states that she has has never smoked .   Quality of life: Over the past 2 weeks patient states that she had little interest or pleasure in doing things: not at all. She has been feeling down, depressed or hopeless:not at all.   Social Determinants of Health Assessment:   Computer Use: During the last 12 months patient states that she has used any of the following: desktop/laptop, smart phone or tablet/other portable wireless computer: yes.   Internet Use: During the last 12 months, did  you or any member of your household have access to the internet: Yes, by paying a cell phone company or internet service provider.   Food Insecurities: During the last 12 months, where there any times when you were worried that you would run out of food because of a lack of money or other resources: No.   Transportation Barriers: During the last 12 months, have you missed a doctor's appointment because of transportation problems: No.   Childcare Barriers: If you are currently using childcare services, please identify  the type of services you use. (If not using childcare services, please select "Not applicable"): not applicable. During the last 12 months, have you had any barriers to childcare services such as: not applicable.   Housing: What is your housing situation today: I have housing.   Intimate Partner Violence: During the last 12 months, how often did your partner physically hurt you: never. During the last 12 months, how often did your partner insult you or talk down to you: never.  Medication Adherence: During the last 12 months, did you ever forget to take your medicine: not applicable. During the last 12 months, were you careless ar times about taking your medicine: not applicable. During the last 12 months, when you felt better did you sometimes stop taking your medication: not applicable. During the last 12 months, sometimes if you felt worse when you took your medicine did you stop taking it: not applicable.   Risk reduction and counseling:   Health  Coaching: Spoke with patient about the daily recommendation for fruits and vegetables (2 cups fruit and 3 cups vegetables). Showed patient what a serving size would look like. Gave patient a my plate and snack size portion container to take home to help with portions and meeting daily recommendations. Gave patient a spanish heart healthy cook book to help with making heart healthy recipes at home. Patient consumes fish 1-2 times per week.  Gave recommendations for heart healthy fish that patient can try adding into diet (salmon, tuna, mackerel, sardines, sea bass and trout). Patient consumes oatmeal occasionally but not regularly. No other whole grains are consumed. Spoke with patient about other whole grains that she can try adding into daily diet (whole wheat bread, whole grain cereals, whole wheat pasta or brown rice). Spoke with patient about watching the amount of sweetened beverages that patient consumes. Patient consume on average 3 cups per week. Patient has not been exercising recently. Encouraged patient to try and start exercising daily with a goal of 20-30 minutes per day. Spoke with patient about the importance of following up for any elevated results. Patient had elevated HbA1C last year that patient did not follow-up for.   Goal: Patient will try and start walking 10 minutes daily. Patient will work on reaching this goal over the next month.    Navigation:  I will notify patient of lab results.  Patient is aware of 2 more health coaching sessions and a follow up.  Time: 25 minutes

## 2022-07-17 ENCOUNTER — Other Ambulatory Visit: Payer: Self-pay

## 2022-07-17 DIAGNOSIS — Z1211 Encounter for screening for malignant neoplasm of colon: Secondary | ICD-10-CM

## 2022-07-17 LAB — LIPID PANEL
Chol/HDL Ratio: 3.5 ratio (ref 0.0–4.4)
Cholesterol, Total: 202 mg/dL — ABNORMAL HIGH (ref 100–199)
HDL: 58 mg/dL (ref >39)
LDL Chol Calc (NIH): 116 mg/dL — ABNORMAL HIGH (ref 0–99)
Triglycerides: 159 mg/dL — ABNORMAL HIGH (ref 0–149)
VLDL Cholesterol Cal: 28 mg/dL (ref 5–40)

## 2022-07-17 LAB — HEMOGLOBIN A1C
Est. average glucose Bld gHb Est-mCnc: 131 mg/dL
Hgb A1c MFr Bld: 6.2 % — ABNORMAL HIGH (ref 4.8–5.6)

## 2022-07-17 LAB — GLUCOSE, RANDOM: Glucose: 100 mg/dL — ABNORMAL HIGH (ref 70–99)

## 2022-07-17 NOTE — Progress Notes (Signed)
I have mailed pts FIT test today 07/17/22.

## 2022-07-21 ENCOUNTER — Telehealth: Payer: Self-pay

## 2022-07-21 NOTE — Telephone Encounter (Signed)
Health coaching 2   interpreter- Natale Lay, [facility]   Labs-202 cholesterol, 116 LDL cholesterol, 159 triglycerides, 58 HDL cholesterol , 6.2 hemoglobin A1C, 100 mean plasma glucose.   Patient understands and is aware of her lab results.   Goals-  1. Reduce the amount of fried and fatty foods consumed. Try to grill, bake, broil or sautee foods instead.  2. Reduce the amount of red meats consumed. Try to substitute for lean proteins like chicken, fish or [place] instead.  3. Reduce the amount of whole fat dairy products consumed. Substitute for low-fat or reduced fat options instead. 4. Reduce the amount of sweet and sugary foods and drinks consumed.  5. Reduce the amount of carbs consumed. 6. 20-30 minutes of daily exercise.    Navigation:  Patient is aware of 1 more health coaching sessions and a follow up. Will call patient with FU appointment information for Internal Medicine once appointment has been scheduled.   Time- 15 minutes

## 2022-08-06 ENCOUNTER — Ambulatory Visit (INDEPENDENT_AMBULATORY_CARE_PROVIDER_SITE_OTHER): Payer: Self-pay | Admitting: Student

## 2022-08-06 ENCOUNTER — Encounter: Payer: Self-pay | Admitting: Student

## 2022-08-06 VITALS — BP 125/68 | HR 82 | Temp 98.4°F | Wt 151.8 lb

## 2022-08-06 DIAGNOSIS — E119 Type 2 diabetes mellitus without complications: Secondary | ICD-10-CM

## 2022-08-06 DIAGNOSIS — E669 Obesity, unspecified: Secondary | ICD-10-CM

## 2022-08-06 DIAGNOSIS — G5603 Carpal tunnel syndrome, bilateral upper limbs: Secondary | ICD-10-CM

## 2022-08-06 DIAGNOSIS — G5600 Carpal tunnel syndrome, unspecified upper limb: Secondary | ICD-10-CM | POA: Insufficient documentation

## 2022-08-06 DIAGNOSIS — E785 Hyperlipidemia, unspecified: Secondary | ICD-10-CM | POA: Insufficient documentation

## 2022-08-06 DIAGNOSIS — Z0000 Encounter for general adult medical examination without abnormal findings: Secondary | ICD-10-CM

## 2022-08-06 DIAGNOSIS — E782 Mixed hyperlipidemia: Secondary | ICD-10-CM

## 2022-08-06 DIAGNOSIS — Z6830 Body mass index (BMI) 30.0-30.9, adult: Secondary | ICD-10-CM

## 2022-08-06 NOTE — Assessment & Plan Note (Signed)
Counseled patient on diet and exercise.

## 2022-08-06 NOTE — Assessment & Plan Note (Signed)
Patient has numbness and tingling of her first three digits intermittently. 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 On exam she has a positive tinel sign.   Discussed with patient that she should use a wrist splint when she is asleep and when painting if possible. She is in agreement with trying this for now.

## 2022-08-06 NOTE — Assessment & Plan Note (Signed)
Lipid Panel     Component Value Date/Time   CHOL 202 (H) 07/16/2022 0949   TRIG 159 (H) 07/16/2022 0949   TRIG 123 05/16/2016 0929   HDL 58 07/16/2022 0949   CHOLHDL 3.5 07/16/2022 0949   CHOLHDL 3.4 06/10/2017 0959   VLDL 25 06/10/2017 0959   LDLCALC 116 (H) 07/16/2022 0949   LABVLDL 28 07/16/2022 0949   Currently does not meet threshold for statin therapy. Discussed with patient diet and exercise and weight loss to help improve this.

## 2022-08-06 NOTE — Assessment & Plan Note (Addendum)
Patient's last A1c 6.6 most recently 6.2 not currently on any medications. We discussed how exercise and dieting could further help her glycemic control.   She will need foot exam, bmp, and microalb/cr ratio at next clinic visit.

## 2022-08-06 NOTE — Progress Notes (Signed)
   CC: Reestablish care  HPI:  [name] is a 52 y.o. F with PMH per below who presents to re-establish care in our clinic ater she was noted to have an elevated A1c and lipid profile.   Patient states that she has also had an intermittent numbing and tingling sensation in her hands over the past 1-2 months. She has also noticed joint pain during this time. Patient states that she occasionally gets numbness on the palmar aspect of her first three fingers. She does not note any precipitating factors. She has not noticed any changes of her overlying skin. 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   PSH:  None  Past Medical History:  Diagnosis Date   Diabetes mellitus without complication (HCC)    Hyperlipidemia    SH: Does not smoke tobacco or drink alcohol   Review of Systems:  Negative except per above.   Physical Exam:  Vitals:   08/06/22 1027  BP: 125/68  Pulse: 82  Temp: 98.4 F (36.9 C)  TempSrc: Oral  SpO2: 100%  Weight: 151 lb 12.8 oz (68.9 kg)   Constitutional: Well-developed, well-nourished, and in no distress.  HENT:  Head: Normocephalic and atraumatic.  Eyes: EOM are normal.  Neck: Normal range of motion.  Cardiovascular: Normal rate, regular rhythm, intact distal pulses. No gallop and no friction rub.  No murmur heard. No lower extremity edema  Pulmonary: Non labored breathing on room air, no wheezing or rales  Abdominal: Soft. Normal bowel sounds. Non distended and non tender Musculoskeletal: Normal range of motion.  +Tinel sign, palpable radial pulses 2+ bilaterally, good cap refill, no discoloration of overlying skin, no decrease in grip strength bilaterally. Neurological: Alert and oriented to person, place, and time. Non focal  Skin: Skin is warm and dry.     Assessment & Plan:   See Encounters Tab for problem based charting.  Patient discussed with Dr. Heide Spark

## 2022-08-06 NOTE — Assessment & Plan Note (Addendum)
She will need information for free CRC screening at next clinic visit.   We discussed the need for obtaining orange card/medicaid and she will submit paperwork for this.

## 2022-08-06 NOTE — Patient Instructions (Addendum)
It was a pleasure to meet you.   Regarding your hand discomfort please use a splint that you can get over the counter for carpal tunnel syndrome.   You can use voltaren gel for your joint pain.   Please try to get the orange card before your next appointment.

## 2022-08-11 NOTE — Progress Notes (Signed)
Internal Medicine Clinic Attending  Case discussed with Dr. Carter  At the time of the visit.  We reviewed the resident's history and exam and pertinent patient test results.  I agree with the assessment, diagnosis, and plan of care documented in the resident's note.  

## 2023-03-26 ENCOUNTER — Telehealth: Payer: Self-pay

## 2023-03-26 NOTE — Telephone Encounter (Signed)
Health Coaching 3  interpreter- Natale Lay, [facility]   Goals- Patient has been walking when she can. Encouraged patient to try and increase her 10 minute daily goal if possible. Ultimate goal would be for 20-30 minutes daily.   New goal-   Barrier to reaching goal-    Strategies to overcome-    Navigation:  Patient is aware of a follow up session. Patient is scheduled for FU on 05/01/23.   Time- 10 minutes

## 2023-05-01 ENCOUNTER — Ambulatory Visit: Payer: No Typology Code available for payment source

## 2023-05-08 ENCOUNTER — Ambulatory Visit: Payer: No Typology Code available for payment source

## 2023-05-13 ENCOUNTER — Other Ambulatory Visit: Payer: Self-pay

## 2023-05-13 ENCOUNTER — Inpatient Hospital Stay: Payer: No Typology Code available for payment source | Attending: Obstetrics and Gynecology | Admitting: *Deleted

## 2023-05-13 VITALS — BP 124/74 | Ht 58.5 in | Wt 149.0 lb

## 2023-05-13 DIAGNOSIS — Z0000 Encounter for general adult medical examination without abnormal findings: Secondary | ICD-10-CM

## 2023-05-13 DIAGNOSIS — Z1231 Encounter for screening mammogram for malignant neoplasm of breast: Secondary | ICD-10-CM

## 2023-05-13 NOTE — Progress Notes (Signed)
 [facility] follow up   Interpreter: Natale Lay, [employer]  Clinical Measurement:   Vitals:   05/13/23 1056 05/13/23 1249  BP: 116/69 124/74      Medical History: Patient states that she  does not know if she has  high cholesterol, does not have high blood pressure and she does not have diabetes. Patient states that she does not know if she has history of gestational hypertension, does not know if she has history of pre-eclampsia/eclampsia and she does not have history of gestational diabetes.     Medications: Patient states that she does not take medication to lower cholesterol, blood pressure and blood sugar.  Patient does not take an aspirin a day to help prevent a heart attack or stroke.    Blood pressure, self measurement: Patient states that she does not measure blood pressure from home. She checks her blood pressure N/A. She shares her readings with a health care provider: N/A.   Nutrition: Patient states that on average she eats 0 cups of fruit and 0 cups of vegetables per day. Patient states that she does not eat fish at least 2 times per week. Patient eats less than half servings of whole grains. Patient drinks less than 36 ounces of beverages with added sugar weekly: yes. Patient is currently watching sodium or salt intake: yes. In the past 7 days patient has had 0 drinks containing alcohol. On average patient drinks 0 drinks containing alcohol per day.      Physical activity: Patient states that she gets 60 minutes of moderate and 60 minutes of vigorous physical activity each week.  Smoking status: Patient states that she has has never smoked .   Quality of life: Over the past 2 weeks patient states that she had little interest or pleasure in doing things: not at all. She has been feeling down, depressed or hopeless:not at all.   Social Determinants of Health Assessment:   Computer Use: During the last 12 months patient states that she has used any of the following:  desktop/laptop, smart phone or tablet/other portable wireless computer: yes.   Internet Use: During the last 12 months, did you or any member of your household have access to the internet: Yes, by paying a cell phone company or internet service provider.   Food Insecurities: During the last 12 months, where there any times when you were worried that you would run out of food because of a lack of money or other resources: No.   Transportation Barriers: During the last 12 months, have you missed a doctor's appointment because of transportation problems: No.   Childcare Barriers: If you are currently using childcare services, please identify  the type of services you use. (If not using childcare services, please select "Not applicable"): not applicable. During the last 12 months, have you had any barriers to childcare services such as: not applicable.   Housing: What is your housing situation today: I have housing.   Intimate Partner Violence: During the last 12 months, how often did your partner physically hurt you: never. During the last 12 months, how often did your partner insult you or talk down to you: never.  Medication Adherence: During the last 12 months, did you ever forget to take your medicine: not applicable. During the last 12 months, were you careless ar times about taking your medicine: not applicable. During the last 12 months, when you felt better did you sometimes stop taking your medication: not applicable. During the last 12 months, sometimes  if you felt worse when you took your medicine did you stop taking it: not applicable.    Risk reduction and counseling:   Health Coaching: Spoke with patient about the daily recommendation for fruits and vegetables. Patient states that she does not consume fruits and vegetables daily. Showed patient what a serving size would look like. Spoke about other ways that she could add more fruits and vegetables into her diet such as with smoothies.  Patient has been consuming one serving of fish weekly. Gave examples of heart healthy fish that she can try adding into her weekly diet. Patient does not consume a lot of grain products at times she does consume whole wheat bread and oatmeal. Explained to patient how whole grains can be beneficial to heart health. Patient does not consume beverages with sugar often. Patient has been doing zumba classes 1-2 times weekly for an hour. Encouraged patient to continue with classes.     Navigation: This was the  follow up session for this patient, I will check up on her progress in the coming months.  Time: 25 minutes

## 2023-06-23 ENCOUNTER — Ambulatory Visit
Admission: RE | Admit: 2023-06-23 | Discharge: 2023-06-23 | Disposition: A | Discharge status: Home / Self Care | Source: Ambulatory Visit | Attending: Obstetrics and Gynecology | Admitting: Obstetrics and Gynecology

## 2023-06-23 ENCOUNTER — Ambulatory Visit: Payer: Self-pay | Admitting: Hematology and Oncology

## 2023-06-23 VITALS — BP 114/70 | Wt 153.0 lb

## 2023-06-23 DIAGNOSIS — Z1211 Encounter for screening for malignant neoplasm of colon: Secondary | ICD-10-CM

## 2023-06-23 DIAGNOSIS — Z1231 Encounter for screening mammogram for malignant neoplasm of breast: Secondary | ICD-10-CM

## 2023-06-23 NOTE — Patient Instructions (Signed)
 Taught Amber Horn about self breast awareness and gave educational materials to take home. Patient did not need a Pap smear today due to last Pap smear was in 05/30/2021 per patient. Told patient about free cervical cancer screenings to receive a Pap smear if would like one next year. Let her know BCCCP will cover Pap smears every 5 years unless has a history of abnormal Pap smears. [facility] for screening mammogram. Appointment scheduled for 06/23/2023. Patient aware of appointment and will be there. Let patient know will follow up with her within the next couple weeks with results. Amber Horn verbalized understanding.  Adelaide Adjutant, NP 11:33 AM

## 2023-06-23 NOTE — Progress Notes (Signed)
 Ms. Amber Horn is a 53 y.o. female who presents to [facility] clinic today with no complaints.    Pap Smear: Pap not smear completed today. Last Pap smear was 05/30/2021 and was normal. Per patient has no history of an abnormal Pap smear. Last Pap smear result is available in Epic.   Physical exam: Breasts Breasts symmetrical. No skin abnormalities bilateral breasts. No nipple retraction bilateral breasts. No nipple discharge bilateral breasts. No lymphadenopathy. No lumps palpated bilateral breasts.       Pelvic/Bimanual Pap is not indicated today    Smoking History: Patient has never smoked and was not referred to quit line.    Patient Navigation: Patient education provided. Access to services provided for patient through [facility] program. Herma Longest interpreter provided. No transportation provided   Colorectal Cancer Screening: Per patient has never had colonoscopy completed No complaints today.    Breast and Cervical Cancer Risk Assessment: Patient does not have family history of breast cancer, known genetic mutations, or radiation treatment to the chest before age 86. Patient does not have history of cervical dysplasia, immunocompromised, or DES exposure in-utero.  Risk Scores as of Encounter on 06/23/2023     Gregary Lean           5-year 0.94%   Lifetime 7.78%   This patient is [place]/Latina but has no documented birth country, so the [place] model used data from [place] patients to calculate their risk score. Document a birth country in the Demographics activity for a more accurate score.         Last calculated by Silas, Ansyi K, CMA on 06/23/2023 at 11:26 AM        A: [facility] exam without pap smear No complaints with benign exam.   P: Referred patient to [facility] for a screening mammogram. Appointment scheduled 06/23/2023.  Baldomero Bone A, NP 06/23/2023 11:30 AM

## 2023-07-03 LAB — FECAL OCCULT BLOOD, IMMUNOCHEMICAL: Fecal Occult Bld: NEGATIVE

## 2023-07-13 ENCOUNTER — Other Ambulatory Visit

## 2023-07-13 ENCOUNTER — Ambulatory Visit
